# Patient Record
Sex: Female | Born: 1960 | Race: White | Hispanic: No | Marital: Married | Smoking: Never smoker
Health system: Southern US, Community
[De-identification: ages and names within clinical notes are randomized; demographics above are authoritative.]

## PROBLEM LIST (undated history)

## (undated) DIAGNOSIS — D229 Melanocytic nevi, unspecified: Secondary | ICD-10-CM

## (undated) DIAGNOSIS — M7500 Adhesive capsulitis of unspecified shoulder: Secondary | ICD-10-CM

## (undated) HISTORY — DX: Melanocytic nevi, unspecified: D22.9

## (undated) HISTORY — DX: Adhesive capsulitis of unspecified shoulder: M75.00

---

## 1997-11-05 ENCOUNTER — Other Ambulatory Visit: Admission: RE | Admit: 1997-11-05 | Discharge: 1997-11-05 | Payer: Self-pay | Admitting: Obstetrics and Gynecology

## 1998-12-26 ENCOUNTER — Other Ambulatory Visit: Admission: RE | Admit: 1998-12-26 | Discharge: 1998-12-26 | Payer: Self-pay | Admitting: *Deleted

## 1999-12-26 ENCOUNTER — Other Ambulatory Visit: Admission: RE | Admit: 1999-12-26 | Discharge: 1999-12-26 | Payer: Self-pay | Admitting: Obstetrics and Gynecology

## 2001-01-10 ENCOUNTER — Other Ambulatory Visit: Admission: RE | Admit: 2001-01-10 | Discharge: 2001-01-10 | Payer: Self-pay | Admitting: Obstetrics and Gynecology

## 2002-02-11 ENCOUNTER — Other Ambulatory Visit: Admission: RE | Admit: 2002-02-11 | Discharge: 2002-02-11 | Payer: Self-pay | Admitting: Obstetrics and Gynecology

## 2003-03-09 ENCOUNTER — Other Ambulatory Visit: Admission: RE | Admit: 2003-03-09 | Discharge: 2003-03-09 | Payer: Self-pay | Admitting: Obstetrics and Gynecology

## 2004-03-15 ENCOUNTER — Other Ambulatory Visit: Admission: RE | Admit: 2004-03-15 | Discharge: 2004-03-15 | Payer: Self-pay | Admitting: Obstetrics and Gynecology

## 2004-11-21 ENCOUNTER — Ambulatory Visit (HOSPITAL_COMMUNITY): Admission: RE | Admit: 2004-11-21 | Discharge: 2004-11-21 | Payer: Self-pay | Admitting: Obstetrics and Gynecology

## 2004-12-06 ENCOUNTER — Encounter: Admission: RE | Admit: 2004-12-06 | Discharge: 2004-12-06 | Payer: Self-pay | Admitting: Obstetrics and Gynecology

## 2005-04-02 ENCOUNTER — Other Ambulatory Visit: Admission: RE | Admit: 2005-04-02 | Discharge: 2005-04-02 | Payer: Self-pay | Admitting: Obstetrics and Gynecology

## 2006-04-03 ENCOUNTER — Other Ambulatory Visit: Admission: RE | Admit: 2006-04-03 | Discharge: 2006-04-03 | Payer: Self-pay | Admitting: Obstetrics & Gynecology

## 2007-04-23 ENCOUNTER — Other Ambulatory Visit: Admission: RE | Admit: 2007-04-23 | Discharge: 2007-04-23 | Payer: Self-pay | Admitting: Obstetrics & Gynecology

## 2008-03-24 ENCOUNTER — Encounter: Admission: RE | Admit: 2008-03-24 | Discharge: 2008-03-24 | Payer: Self-pay | Admitting: Obstetrics & Gynecology

## 2008-04-29 ENCOUNTER — Other Ambulatory Visit: Admission: RE | Admit: 2008-04-29 | Discharge: 2008-04-29 | Payer: Self-pay | Admitting: Obstetrics & Gynecology

## 2008-05-08 ENCOUNTER — Emergency Department (HOSPITAL_COMMUNITY): Admission: EM | Admit: 2008-05-08 | Discharge: 2008-05-08 | Payer: Self-pay | Admitting: Emergency Medicine

## 2010-05-06 ENCOUNTER — Other Ambulatory Visit: Payer: Self-pay | Admitting: Obstetrics & Gynecology

## 2010-05-06 DIAGNOSIS — Z1239 Encounter for other screening for malignant neoplasm of breast: Secondary | ICD-10-CM

## 2010-05-06 DIAGNOSIS — Z1231 Encounter for screening mammogram for malignant neoplasm of breast: Secondary | ICD-10-CM

## 2010-05-22 ENCOUNTER — Ambulatory Visit
Admission: RE | Admit: 2010-05-22 | Discharge: 2010-05-22 | Disposition: A | Discharge status: Home / Self Care | Payer: BC Managed Care – PPO | Source: Ambulatory Visit | Attending: Obstetrics & Gynecology | Admitting: Obstetrics & Gynecology

## 2010-05-22 DIAGNOSIS — Z1231 Encounter for screening mammogram for malignant neoplasm of breast: Secondary | ICD-10-CM

## 2010-07-31 LAB — POCT I-STAT, CHEM 8
BUN: 22 mg/dL (ref 6–23)
Chloride: 104 mEq/L (ref 96–112)
Creatinine, Ser: 0.8 mg/dL (ref 0.4–1.2)
Sodium: 135 mEq/L (ref 135–145)
TCO2: 23 mmol/L (ref 0–100)

## 2011-05-29 ENCOUNTER — Other Ambulatory Visit: Payer: Self-pay | Admitting: Obstetrics & Gynecology

## 2011-05-29 DIAGNOSIS — Z1231 Encounter for screening mammogram for malignant neoplasm of breast: Secondary | ICD-10-CM

## 2011-06-21 ENCOUNTER — Ambulatory Visit
Admission: RE | Admit: 2011-06-21 | Discharge: 2011-06-21 | Disposition: A | Discharge status: Home / Self Care | Payer: BC Managed Care – PPO | Source: Ambulatory Visit | Attending: Obstetrics & Gynecology | Admitting: Obstetrics & Gynecology

## 2011-06-21 DIAGNOSIS — Z1231 Encounter for screening mammogram for malignant neoplasm of breast: Secondary | ICD-10-CM

## 2012-07-23 ENCOUNTER — Other Ambulatory Visit: Payer: Self-pay

## 2012-07-23 MED ORDER — MINOCYCLINE HCL 100 MG PO CAPS: 100.0000 mg | ORAL_CAPSULE | Freq: Two times a day (BID) | ORAL | Status: DC

## 2012-07-24 ENCOUNTER — Encounter: Payer: Self-pay | Admitting: Obstetrics & Gynecology

## 2012-07-24 ENCOUNTER — Ambulatory Visit (INDEPENDENT_AMBULATORY_CARE_PROVIDER_SITE_OTHER): Payer: BC Managed Care – PPO | Admitting: Obstetrics & Gynecology

## 2012-07-24 VITALS — BP 112/68 | Ht 66.0 in | Wt 113.8 lb

## 2012-07-24 DIAGNOSIS — Z01419 Encounter for gynecological examination (general) (routine) without abnormal findings: Secondary | ICD-10-CM

## 2012-07-24 DIAGNOSIS — Z0000 Encounter for general adult medical examination without abnormal findings: Secondary | ICD-10-CM

## 2012-07-24 LAB — POCT URINALYSIS DIPSTICK
Bilirubin, UA: NEGATIVE
Glucose, UA: NEGATIVE
Ketones, UA: NEGATIVE
Leukocytes, UA: NEGATIVE
Protein, UA: NEGATIVE

## 2012-07-24 MED ORDER — NORGESTIM-ETH ESTRAD TRIPHASIC 0.18/0.215/0.25 MG-35 MCG PO TABS: 1.0000 | ORAL_TABLET | Freq: Every day | ORAL | Status: DC

## 2012-07-24 NOTE — Patient Instructions (Signed)

## 2012-07-24 NOTE — Progress Notes (Signed)
52 y.o. No obstetric history on file. MarriedCaucasianF here for annual exam.  Saw derm--Dr Haverstock--last week.  Recommended that she be off the minocycline.  Recommended Retin A product.  Moved to [place] to new home after selling her house and husband's house.  Last cycle was abnormal but she took her pills incorrectly in the move.  Flow is typically four days.  Heavy for one day.    Patient's last menstrual period was 06/29/2012.          Sexually active: yes  The current method of family planning is OCP (estrogen/progesterone).    Exercising: yes  pilates and exercise bike Smoker:  no  Health Maintenance: Pap:  06/21/11 WNL/negative HR HPV MMG:  06/21/11 normal Colonoscopy:  none BMD:   none TDaP:  2008 or 2009 Labs: 2013 here--all normal   reports that she has never smoked. She does not have any smokeless tobacco history on file. She reports that she does not drink alcohol or use illicit drugs.  Past Medical History  Diagnosis Date  . Atypical nevi     History reviewed. No pertinent past surgical history.  Current Outpatient Prescriptions  Medication Sig Dispense Refill  . minocycline (MINOCIN) 100 MG capsule Take 1 capsule (100 mg total) by mouth 2 (two) times daily.  60 capsule  0  . Multiple Vitamins-Minerals (MULTIVITAMIN PO) Take by mouth daily.      Lorita Officer Triphasic (TRI-SPRINTEC PO) Take by mouth daily.       No current facility-administered medications for this visit.    Family History  Problem Relation Age of Onset  . Cancer Maternal Grandmother   . Heart attack Father   . Alcohol abuse Paternal Grandfather   . Alcohol abuse Maternal Grandfather     ROS:  Pertinent items are noted in HPI.  Otherwise, a comprehensive ROS was negative.  Exam:   BP 112/68  Ht 5\' 6"  (1.676 m)  Wt 113 lb 12.8 oz (51.619 kg)  BMI 18.38 kg/m2  LMP 06/29/2012  Height:   Height: 5\' 6"  (167.6 cm)  Ht Readings from Last 3 Encounters:  07/24/12 5\' 6"  (1.676 m)     General appearance: alert, cooperative and appears stated age Head: Normocephalic, without obvious abnormality, atraumatic Neck: no adenopathy, supple, symmetrical, trachea midline and thyroid normal to inspection and palpation Lungs: clear to auscultation bilaterally Breasts: normal appearance, no masses or tenderness Heart: regular rate and rhythm Abdomen: soft, non-tender; bowel sounds normal; no masses,  no organomegaly Extremities: extremities normal, atraumatic, no cyanosis or edema Skin: Skin color, texture, turgor normal. No rashes or lesions Lymph nodes: Cervical, supraclavicular, and axillary nodes normal. No abnormal inguinal nodes palpated Neurologic: Grossly normal   Pelvic: External genitalia:  no lesions              Urethra:  normal appearing urethra with no masses, tenderness or lesions              Bartholins and Skenes: normal                 Vagina: normal appearing vagina with normal color and discharge, no lesions              Cervix: nabothian cyst and no lesions              Pap taken: no Bimanual Exam:  Uterus:  enlarged, 10-12 weeks size, mobile and off to pt's right--stable  Adnexa: no mass, fullness, tenderness               Rectovaginal: Confirms               Anus:  normal sphincter tone, no lesions  A:  Well Woman with normal exam H/o enlarged uterus c/w fibroids--stable in size Acne--seeing dermatologist yearly  P:   Mammogram yearly--d/w pt 3D Refill trisprintec qd one month/RF 1 year return annually or prn  An After Visit Summary was printed and given to the patient.

## 2012-07-25 LAB — HEMOGLOBIN, FINGERSTICK: Hemoglobin, fingerstick: 12.4 g/dL (ref 12.0–16.0)

## 2012-07-28 ENCOUNTER — Telehealth: Payer: Self-pay | Admitting: *Deleted

## 2012-07-28 NOTE — Telephone Encounter (Signed)
Called pt to see if she needed a rf on her tri previfem, pt says that she does not need it. Pt was here 07/24/12 for aex had rf x 1 year sent to her pharmacy

## 2012-08-18 ENCOUNTER — Other Ambulatory Visit: Payer: Self-pay

## 2012-08-18 DIAGNOSIS — Z1231 Encounter for screening mammogram for malignant neoplasm of breast: Secondary | ICD-10-CM

## 2012-09-23 ENCOUNTER — Ambulatory Visit
Admission: RE | Admit: 2012-09-23 | Discharge: 2012-09-23 | Disposition: A | Discharge status: Home / Self Care | Payer: BC Managed Care – PPO | Source: Ambulatory Visit

## 2012-09-23 DIAGNOSIS — Z1231 Encounter for screening mammogram for malignant neoplasm of breast: Secondary | ICD-10-CM

## 2013-01-09 ENCOUNTER — Encounter: Payer: Self-pay | Admitting: Obstetrics & Gynecology

## 2013-05-06 ENCOUNTER — Encounter: Payer: Self-pay | Admitting: Obstetrics & Gynecology

## 2013-06-28 ENCOUNTER — Other Ambulatory Visit: Payer: Self-pay | Admitting: Obstetrics & Gynecology

## 2013-06-29 ENCOUNTER — Other Ambulatory Visit: Payer: Self-pay

## 2013-08-07 ENCOUNTER — Ambulatory Visit: Payer: BC Managed Care – PPO | Admitting: Obstetrics & Gynecology

## 2013-08-14 ENCOUNTER — Encounter: Payer: Self-pay | Admitting: Obstetrics & Gynecology

## 2013-08-14 ENCOUNTER — Ambulatory Visit (INDEPENDENT_AMBULATORY_CARE_PROVIDER_SITE_OTHER): Payer: BC Managed Care – PPO | Admitting: Obstetrics & Gynecology

## 2013-08-14 VITALS — BP 104/62 | HR 60 | Resp 16 | Ht 66.0 in | Wt 111.0 lb

## 2013-08-14 DIAGNOSIS — N852 Hypertrophy of uterus: Secondary | ICD-10-CM

## 2013-08-14 DIAGNOSIS — Z01419 Encounter for gynecological examination (general) (routine) without abnormal findings: Secondary | ICD-10-CM

## 2013-08-14 DIAGNOSIS — Z124 Encounter for screening for malignant neoplasm of cervix: Secondary | ICD-10-CM

## 2013-08-14 DIAGNOSIS — Z0000 Encounter for general adult medical examination without abnormal findings: Secondary | ICD-10-CM

## 2013-08-14 DIAGNOSIS — N951 Menopausal and female climacteric states: Secondary | ICD-10-CM

## 2013-08-14 LAB — COMPREHENSIVE METABOLIC PANEL
ALT: 12 U/L (ref 0–35)
AST: 17 U/L (ref 0–37)
Albumin: 4 g/dL (ref 3.5–5.2)
Alkaline Phosphatase: 54 U/L (ref 39–117)
BUN: 17 mg/dL (ref 6–23)
CALCIUM: 9.3 mg/dL (ref 8.4–10.5)
CHLORIDE: 102 meq/L (ref 96–112)
CO2: 29 meq/L (ref 19–32)
CREATININE: 0.6 mg/dL (ref 0.50–1.10)
GLUCOSE: 81 mg/dL (ref 70–99)
Potassium: 4.2 mEq/L (ref 3.5–5.3)
Sodium: 136 mEq/L (ref 135–145)
Total Bilirubin: 0.3 mg/dL (ref 0.2–1.2)
Total Protein: 6.4 g/dL (ref 6.0–8.3)

## 2013-08-14 LAB — LIPID PANEL
CHOL/HDL RATIO: 2.1 ratio
CHOLESTEROL: 147 mg/dL (ref 0–200)
HDL: 69 mg/dL (ref >39)
LDL Cholesterol: 65 mg/dL (ref 0–99)
Triglycerides: 65 mg/dL (ref <150)
VLDL: 13 mg/dL (ref 0–40)

## 2013-08-14 LAB — POCT URINALYSIS DIPSTICK
Bilirubin, UA: NEGATIVE
Glucose, UA: NEGATIVE
KETONES UA: NEGATIVE
Leukocytes, UA: NEGATIVE
Nitrite, UA: NEGATIVE
PH UA: 5
PROTEIN UA: NEGATIVE
Urobilinogen, UA: NEGATIVE

## 2013-08-14 LAB — HEMOGLOBIN, FINGERSTICK: Hemoglobin, fingerstick: 13.9 g/dL (ref 12.0–16.0)

## 2013-08-14 MED ORDER — NORETHIN ACE-ETH ESTRAD-FE 1-20 MG-MCG PO TABS: 1.0000 | ORAL_TABLET | Freq: Every day | ORAL | Status: DC

## 2013-08-14 NOTE — Progress Notes (Signed)
53 y.o. G0P0000 (adopted 1) MarriedCaucasianF here for annual exam.  Has lots of plans for camp for the summer for 51 year old daughter.   Patient's last menstrual period was 07/26/2013.          Sexually active: yes  The current method of family planning is OCP (estrogen/progesterone).    Exercising: yes  walking Smoker:  no  Health Maintenance:  Pap: 06/21/11 WNL/negative HR HPV  MMG: 09/23/12 normal  Colonoscopy: none  BMD: none  TDaP: 2008 or 2009  Labs: 2013 here--all normal  Screening Labs: today, Hb today: today, Urine today: resulted   reports that she has never smoked. She has never used smokeless tobacco. She reports that she does not drink alcohol or use illicit drugs.  Past Medical History  Diagnosis Date  . Atypical nevi     History reviewed. No pertinent past surgical history.  Current Outpatient Prescriptions  Medication Sig Dispense Refill  . Multiple Vitamins-Minerals (MULTIVITAMIN PO) Take by mouth daily.      . TRI-PREVIFEM 0.18/0.215/0.25 MG-35 MCG tablet TAKE 1 TABLET EVERY DAY  84 tablet  0   No current facility-administered medications for this visit.    Family History  Problem Relation Age of Onset  . Cancer Maternal Grandmother   . Heart attack Father   . Alcohol abuse Paternal Grandfather   . Alcohol abuse Maternal Grandfather     ROS:  Pertinent items are noted in HPI.  Otherwise, a comprehensive ROS was negative.  Exam:   BP 104/62  Pulse 60  Resp 16  Ht 5\' 6"  (1.676 m)  Wt 111 lb (50.349 kg)  BMI 17.92 kg/m2  LMP 07/26/2013  Weight change:-2lb   Height: 5\' 6"  (167.6 cm)  Ht Readings from Last 3 Encounters:  08/14/13 5\' 6"  (1.676 m)  07/24/12 5\' 6"  (1.676 m)    General appearance: alert, cooperative and appears stated age Head: Normocephalic, without obvious abnormality, atraumatic Neck: no adenopathy, supple, symmetrical, trachea midline and thyroid normal to inspection and palpation Lungs: clear to auscultation  bilaterally Breasts: normal appearance, no masses or tenderness Heart: regular rate and rhythm Abdomen: soft, non-tender; bowel sounds normal; no masses,  no organomegaly Extremities: extremities normal, atraumatic, no cyanosis or edema Skin: Skin color, texture, turgor normal. No rashes or lesions Lymph nodes: Cervical, supraclavicular, and axillary nodes normal. No abnormal inguinal nodes palpated Neurologic: Grossly normal   Pelvic: External genitalia:  no lesions              Urethra:  normal appearing urethra with no masses, tenderness or lesions              Bartholins and Skenes: normal                 Vagina: normal appearing vagina with normal color and discharge, no lesions              Cervix: no lesions              Pap taken: yes Bimanual Exam:  Uterus:  enlarged, 8-10 weeks sized weeks size , off to left              Adnexa: normal adnexa               Rectovaginal: Confirms               Anus:  normal sphincter tone, no lesions  A:  Well Woman with normal exam Grade 4 breast density. On OCps  for [facility] Acne, off antibiotics  P:   Mammogram yearly.  3D discussed.   pap smear today.  HR HPV 2013 and neg. CMP, FSH, Lipids Change OCPs to Loestrin due to being 53.  Rx to pharmacy for 90 day supply Declines colonoscopy.  Advised I will continue to encourage her to do this. return annually or prn  An After Visit Summary was printed and given to the patient.

## 2013-08-14 NOTE — Progress Notes (Deleted)
53 y.o. G0P0000 MarriedCaucasianF here for annual exam.    Patient's last menstrual period was 07/26/2013.          Sexually active: yes  The current method of family planning is OCP (estrogen/progesterone).    Exercising: yes  pilates, walking, and exercise bike Smoker:  no  Health Maintenance: Pap:  06/21/11 WNL/negative HR HPV History of abnormal Pap:  no MMG:  09/23/12-WNL Colonoscopy:  none BMD:   none TDaP:  2008 or 2009 Screening Labs: ***, Hb today: ***, Urine today: trace   reports that she has never smoked. She has never used smokeless tobacco. She reports that she does not drink alcohol or use illicit drugs.  Past Medical History  Diagnosis Date  . Atypical nevi     History reviewed. No pertinent past surgical history.  Current Outpatient Prescriptions  Medication Sig Dispense Refill  . Multiple Vitamins-Minerals (MULTIVITAMIN PO) Take by mouth daily.      . TRI-PREVIFEM 0.18/0.215/0.25 MG-35 MCG tablet TAKE 1 TABLET EVERY DAY  84 tablet  0   No current facility-administered medications for this visit.    Family History  Problem Relation Age of Onset  . Cancer Maternal Grandmother   . Heart attack Father   . Alcohol abuse Paternal Grandfather   . Alcohol abuse Maternal Grandfather     ROS:  Pertinent items are noted in HPI.  Otherwise, a comprehensive ROS was negative.  Exam:   BP 104/62  Pulse 60  Resp 16  Ht 5\' 6"  (1.676 m)  Wt 111 lb (50.349 kg)  BMI 17.92 kg/m2  LMP 07/26/2013  Weight change: @WEIGHTCHANGE @ Height:   Height: 5\' 6"  (167.6 cm)  Ht Readings from Last 3 Encounters:  08/14/13 5\' 6"  (1.676 m)  07/24/12 5\' 6"  (1.676 m)    General appearance: alert, cooperative and appears stated age Head: Normocephalic, without obvious abnormality, atraumatic Neck: no adenopathy, supple, symmetrical, trachea midline and thyroid {EXAM; THYROID:18604} Lungs: clear to auscultation bilaterally Breasts: {Exam; breast:13139::"normal appearance, no masses or  tenderness"} Heart: regular rate and rhythm Abdomen: soft, non-tender; bowel sounds normal; no masses,  no organomegaly Extremities: extremities normal, atraumatic, no cyanosis or edema Skin: Skin color, texture, turgor normal. No rashes or lesions Lymph nodes: Cervical, supraclavicular, and axillary nodes normal. No abnormal inguinal nodes palpated Neurologic: Grossly normal   Pelvic: External genitalia:  no lesions              Urethra:  normal appearing urethra with no masses, tenderness or lesions              Bartholins and Skenes: normal                 Vagina: normal appearing vagina with normal color and discharge, no lesions              Cervix: {exam; cervix:14595}              Pap taken: {yes no:314532} Bimanual Exam:  Uterus:  {exam; uterus:12215}              Adnexa: {exam; adnexa:12223}               Rectovaginal: Confirms               Anus:  normal sphincter tone, no lesions  A:  Well Woman with normal exam  P:   {plan; gyn:5269::"mammogram","pap smear","return annually or prn"}  An After Visit Summary was printed and given to the patient.

## 2013-08-14 NOTE — Patient Instructions (Signed)

## 2013-08-15 LAB — FOLLICLE STIMULATING HORMONE: FSH: 1.7 m[IU]/mL

## 2013-08-17 LAB — IPS PAP SMEAR ONLY

## 2013-11-26 ENCOUNTER — Other Ambulatory Visit: Payer: Self-pay

## 2013-11-26 DIAGNOSIS — Z1231 Encounter for screening mammogram for malignant neoplasm of breast: Secondary | ICD-10-CM

## 2013-12-04 ENCOUNTER — Ambulatory Visit
Admission: RE | Admit: 2013-12-04 | Discharge: 2013-12-04 | Disposition: A | Discharge status: Home / Self Care | Payer: BC Managed Care – PPO | Source: Ambulatory Visit

## 2013-12-04 DIAGNOSIS — Z1231 Encounter for screening mammogram for malignant neoplasm of breast: Secondary | ICD-10-CM

## 2014-02-15 ENCOUNTER — Encounter: Payer: Self-pay | Admitting: Obstetrics & Gynecology

## 2014-08-21 ENCOUNTER — Other Ambulatory Visit: Payer: Self-pay | Admitting: Obstetrics & Gynecology

## 2014-08-23 NOTE — Telephone Encounter (Signed)
Medication refill request: Junel  Last AEX:  08/14/13 SM Next AEX: 09/02/14 SM Last MMG (if hormonal medication request): 12/07/13 BIRADS2:Benign  Refill authorized: 08/14/13 #3packs w/ 4 Refills. Today #1pack/0R?

## 2014-09-02 ENCOUNTER — Encounter: Payer: Self-pay | Admitting: Obstetrics & Gynecology

## 2014-09-02 ENCOUNTER — Ambulatory Visit (INDEPENDENT_AMBULATORY_CARE_PROVIDER_SITE_OTHER): Payer: BLUE CROSS/BLUE SHIELD | Admitting: Obstetrics & Gynecology

## 2014-09-02 VITALS — BP 104/62 | HR 72 | Resp 14 | Ht 65.75 in | Wt 110.2 lb

## 2014-09-02 DIAGNOSIS — Z01419 Encounter for gynecological examination (general) (routine) without abnormal findings: Secondary | ICD-10-CM

## 2014-09-02 DIAGNOSIS — Z1211 Encounter for screening for malignant neoplasm of colon: Secondary | ICD-10-CM | POA: Diagnosis not present

## 2014-09-02 DIAGNOSIS — Z0000 Encounter for general adult medical examination without abnormal findings: Secondary | ICD-10-CM | POA: Diagnosis not present

## 2014-09-02 DIAGNOSIS — N912 Amenorrhea, unspecified: Secondary | ICD-10-CM | POA: Diagnosis not present

## 2014-09-02 LAB — TSH: TSH: 3.524 u[IU]/mL (ref 0.350–4.500)

## 2014-09-02 LAB — COMPREHENSIVE METABOLIC PANEL
ALBUMIN: 4 g/dL (ref 3.5–5.2)
ALK PHOS: 52 U/L (ref 39–117)
ALT: 12 U/L (ref 0–35)
AST: 18 U/L (ref 0–37)
BUN: 15 mg/dL (ref 6–23)
CALCIUM: 9.4 mg/dL (ref 8.4–10.5)
CHLORIDE: 103 meq/L (ref 96–112)
CO2: 28 meq/L (ref 19–32)
Creat: 0.65 mg/dL (ref 0.50–1.10)
GLUCOSE: 66 mg/dL — AB (ref 70–99)
POTASSIUM: 4 meq/L (ref 3.5–5.3)
SODIUM: 138 meq/L (ref 135–145)
TOTAL PROTEIN: 6.4 g/dL (ref 6.0–8.3)
Total Bilirubin: 0.6 mg/dL (ref 0.2–1.2)

## 2014-09-02 LAB — POCT URINALYSIS DIPSTICK
PH UA: 5
UROBILINOGEN UA: NEGATIVE

## 2014-09-02 LAB — LIPID PANEL
CHOL/HDL RATIO: 2.1 ratio
CHOLESTEROL: 132 mg/dL (ref 0–200)
HDL: 63 mg/dL (ref >=46)
LDL Cholesterol: 58 mg/dL (ref 0–99)
Triglycerides: 55 mg/dL (ref <150)
VLDL: 11 mg/dL (ref 0–40)

## 2014-09-02 MED ORDER — NORETHIN ACE-ETH ESTRAD-FE 1-20 MG-MCG PO TABS: 1.0000 | ORAL_TABLET | Freq: Every day | ORAL | Status: DC

## 2014-09-02 NOTE — Progress Notes (Signed)
54 y.o. G0P0000 MarriedCaucasianF here for annual exam.  {t having minimal bleeding with cycles.  Has skipped cycles for several months.    Patient's last menstrual period was 08/24/2014.          Sexually active: Yes.    The current method of family planning is OCP (estrogen/progesterone).    Exercising: Yes.    walking, pilates, exercise bike 4x/wk Smoker:  no  Health Maintenance: Pap:  08/14/13 wnl no hpv testing done, neg HR HPV 2013 History of abnormal Pap:  no MMG:  12/07/13 bi-rads 2: benign Colonoscopy:  Never had one BMD:   Never had one  TDaP:  2008/2009 Screening Labs: yes , Hb today: 13.5 , Urine today:  WBC's +   reports that she has never smoked. She has never used smokeless tobacco. She reports that she does not drink alcohol or use illicit drugs.  Past Medical History  Diagnosis Date  . Atypical nevi     No past surgical history on file.  Current Outpatient Prescriptions  Medication Sig Dispense Refill  . JUNEL FE 1/20 1-20 MG-MCG tablet TAKE 1 TABLET EVERY DAY 1 Package 0  . Multiple Vitamins-Minerals (MULTIVITAMIN PO) Take by mouth daily.    Marland Kitchen adapalene (DIFFERIN) 0.1 % cream     . spironolactone (ALDACTONE) 50 MG tablet      No current facility-administered medications for this visit.    Family History  Problem Relation Age of Onset  . Cancer Maternal Grandmother   . Heart attack Father   . Alcohol abuse Paternal Grandfather   . Alcohol abuse Maternal Grandfather     ROS:  Pertinent items are noted in HPI.  Otherwise, a comprehensive ROS was negative.  Exam:   Ht 5' 5.75" (1.67 m)  Wt 110 lb 3.2 oz (49.986 kg)  BMI 17.92 kg/m2  LMP 08/24/2014  Weight change: -3#  Height: 5' 5.75" (167 cm)  Ht Readings from Last 3 Encounters:  09/02/14 5' 5.75" (1.67 m)  08/14/13 5\' 6"  (1.676 m)  07/24/12 5\' 6"  (1.676 m)    General appearance: alert, cooperative and appears stated age Head: Normocephalic, without obvious abnormality, atraumatic Neck: no  adenopathy, supple, symmetrical, trachea midline and thyroid normal to inspection and palpation Lungs: clear to auscultation bilaterally Breasts: normal appearance, no masses or tenderness Heart: regular rate and rhythm Abdomen: soft, non-tender; bowel sounds normal; no masses,  no organomegaly Extremities: extremities normal, atraumatic, no cyanosis or edema Skin: Skin color, texture, turgor normal. No rashes or lesions Lymph nodes: Cervical, supraclavicular, and axillary nodes normal. No abnormal inguinal nodes palpated Neurologic: Grossly normal   Pelvic: External genitalia:  no lesions              Urethra:  normal appearing urethra with no masses, tenderness or lesions              Bartholins and Skenes: normal                 Vagina: normal appearing vagina with normal color and discharge, no lesions              Cervix: no lesions              Pap taken: No. Bimanual Exam:  Uterus:  enlarged, 8 weeks size              Adnexa: normal adnexa and no mass, fullness, tenderness               Rectovaginal:  Confirms               Anus:  normal sphincter tone, no lesions  A:  Well Woman with normal exam Grade 4 breast density. On OCPs for BC Acne, off antibiotics H/O enlarged uterus most likely due to fibroids, smaller on exam today  P: Mammogram yearly. 3D discussed.  pap smear not obtained today. HR HPV 2013, neg and neg pap 2015. CMP, FSH, Lipids, TSH (non fasting) Loestrin rx to pharmacy Form for pt partially filled out but will need completion when labs are done. Declines colonoscopy. Advised I will continue to encourage her to do this.  IFOB given today return annually or prn

## 2014-09-03 LAB — FOLLICLE STIMULATING HORMONE: FSH: 35.7 m[IU]/mL

## 2014-09-06 LAB — HEMOGLOBIN, FINGERSTICK: HEMOGLOBIN, FINGERSTICK: 13.5 g/dL (ref 12.0–16.0)

## 2014-11-02 ENCOUNTER — Encounter: Payer: Self-pay | Admitting: Obstetrics & Gynecology

## 2014-11-15 ENCOUNTER — Other Ambulatory Visit: Payer: Self-pay | Admitting: Physical Medicine and Rehabilitation

## 2015-02-01 ENCOUNTER — Other Ambulatory Visit: Payer: Self-pay

## 2015-02-01 DIAGNOSIS — Z1231 Encounter for screening mammogram for malignant neoplasm of breast: Secondary | ICD-10-CM

## 2015-02-23 ENCOUNTER — Ambulatory Visit: Payer: Self-pay

## 2015-03-17 ENCOUNTER — Ambulatory Visit
Admission: RE | Admit: 2015-03-17 | Discharge: 2015-03-17 | Disposition: A | Discharge status: Home / Self Care | Payer: BLUE CROSS/BLUE SHIELD | Source: Ambulatory Visit

## 2015-03-17 DIAGNOSIS — Z1231 Encounter for screening mammogram for malignant neoplasm of breast: Secondary | ICD-10-CM

## 2015-11-16 ENCOUNTER — Other Ambulatory Visit: Payer: Self-pay | Admitting: Obstetrics & Gynecology

## 2015-11-17 ENCOUNTER — Ambulatory Visit: Payer: BLUE CROSS/BLUE SHIELD | Admitting: Obstetrics & Gynecology

## 2015-11-17 NOTE — Telephone Encounter (Signed)
Medication refill request: Junel Last AEX:  09/02/14 SM Next AEX: 11/24/15 SM Last MMG (if hormonal medication request): 03/17/15 BIRADS1  Refill authorized: 09/02/14 #3 Packages 4R. Please advise. Thank you.

## 2015-11-24 ENCOUNTER — Encounter: Payer: Self-pay | Admitting: Obstetrics & Gynecology

## 2015-11-24 ENCOUNTER — Ambulatory Visit (INDEPENDENT_AMBULATORY_CARE_PROVIDER_SITE_OTHER): Payer: BLUE CROSS/BLUE SHIELD | Admitting: Obstetrics & Gynecology

## 2015-11-24 VITALS — BP 90/60 | HR 74 | Resp 12 | Ht 65.75 in | Wt 111.8 lb

## 2015-11-24 DIAGNOSIS — Z124 Encounter for screening for malignant neoplasm of cervix: Secondary | ICD-10-CM

## 2015-11-24 DIAGNOSIS — N951 Menopausal and female climacteric states: Secondary | ICD-10-CM

## 2015-11-24 DIAGNOSIS — Z01419 Encounter for gynecological examination (general) (routine) without abnormal findings: Secondary | ICD-10-CM

## 2015-11-24 MED ORDER — NORETHIN ACE-ETH ESTRAD-FE 1-20 MG-MCG PO TABS: 1.0000 | ORAL_TABLET | Freq: Every day | ORAL | 4 refills | Status: DC

## 2015-11-24 NOTE — Progress Notes (Signed)
55 y.o. G0P0000 MarriedCaucasianF here for annual exam.  Doing well.  Busy with work.  Went to [place] to visit family.   Did international adoption and did lots of blood work and viral testing.  Pretty sure she had a hep C test.  Doing FSH today and may stop OCPs.  For now, going to see what kind of symptoms she has before starting any hormonal therapy.    No LMP recorded (within months).          Sexually active: Yes.    The current method of family planning is OCP (estrogen/progesterone).    Exercising: Yes.    pilates, walking Smoker:  no  Health Maintenance: Pap:  08/14/2013 negative  History of abnormal Pap:  no MMG:  03/17/2015 BIRADS 1 negative Colonoscopy:  Never.  Pt has called for an appt.   BMD:   never TDaP:  2008/2009 Pneumonia vaccine(s):  never Zostavax:   never Hep C testing: did with adoption process Screening Labs: done in May for insurance purposes, patient brought copy with her today, Hb today: done in May, Urine today: done in May    reports that she has never smoked. She has never used smokeless tobacco. She reports that she does not drink alcohol or use drugs.  Past Medical History:  Diagnosis Date  . Atypical nevi     No past surgical history on file.  Current Outpatient Prescriptions  Medication Sig Dispense Refill  . adapalene (DIFFERIN) 0.1 % cream     . JUNEL FE 1/20 1-20 MG-MCG tablet TAKE 1 TABLET BY MOUTH DAILY. 84 tablet 4  . Multiple Vitamins-Minerals (MULTIVITAMIN PO) Take by mouth daily.    Marland Kitchen spironolactone (ALDACTONE) 50 MG tablet      No current facility-administered medications for this visit.     Family History  Problem Relation Age of Onset  . Cancer Maternal Grandmother   . Heart attack Father   . Alcohol abuse Paternal Grandfather   . Alcohol abuse Maternal Grandfather     ROS:  Pertinent items are noted in HPI.  Otherwise, a comprehensive ROS was negative.  Exam:   BP 90/60 (BP Location: Right Arm, Patient Position: Sitting)    Pulse 74   Resp 12   Ht 5' 5.75" (1.67 m)   Wt 111 lb 12.8 oz (50.7 kg)   LMP  (Within Months)   BMI 18.18 kg/m     Height: 5' 5.75" (167 cm)  Ht Readings from Last 3 Encounters:  11/24/15 5' 5.75" (1.67 m)  09/02/14 5' 5.75" (1.67 m)  08/14/13 5\' 6"  (1.676 m)    General appearance: alert, cooperative and appears stated age Head: Normocephalic, without obvious abnormality, atraumatic Neck: no adenopathy, supple, symmetrical, trachea midline and thyroid normal to inspection and palpation Lungs: clear to auscultation bilaterally Breasts: normal appearance, no masses or tenderness Heart: regular rate and rhythm Abdomen: soft, non-tender; bowel sounds normal; no masses,  no organomegaly Extremities: extremities normal, atraumatic, no cyanosis or edema Skin: Skin color, texture, turgor normal. No rashes or lesions Lymph nodes: Cervical, supraclavicular, and axillary nodes normal. No abnormal inguinal nodes palpated Neurologic: Grossly normal   Pelvic: External genitalia:  no lesions              Urethra:  normal appearing urethra with no masses, tenderness or lesions              Bartholins and Skenes: normal  Vagina: normal appearing vagina with normal color and discharge, no lesions              Cervix: no lesions              Pap taken: Yes.   Bimanual Exam:  Uterus:  normal size, contour, position, consistency, mobility, non-tender              Adnexa: normal adnexa and no mass, fullness, tenderness               Rectovaginal: Confirms               Anus:  normal sphincter tone, no lesions  Chaperone was present for exam.  A:  Well Woman with normal exam Grade 4 breast density On OCPs for BC Acne, off antibiotics H/O enlarged uterus most likely due to fibroids  P: Mammogram yearly. 3D discussed.  Pap and HR HPV today.  Labs done for life insurance.  Will scan into EPIC copies of blood work. [place] today. Loestrin rx to pharmacy Pt states  she will call for colonoscopy this year. return annually or prn

## 2015-11-25 LAB — FOLLICLE STIMULATING HORMONE: FSH: 33.4 m[IU]/mL

## 2015-11-28 LAB — IPS PAP TEST WITH HPV

## 2016-04-04 ENCOUNTER — Other Ambulatory Visit: Payer: Self-pay | Admitting: Obstetrics & Gynecology

## 2016-04-04 DIAGNOSIS — Z1231 Encounter for screening mammogram for malignant neoplasm of breast: Secondary | ICD-10-CM

## 2016-06-11 ENCOUNTER — Ambulatory Visit: Payer: BLUE CROSS/BLUE SHIELD

## 2016-06-27 ENCOUNTER — Ambulatory Visit
Admission: RE | Admit: 2016-06-27 | Discharge: 2016-06-27 | Disposition: A | Discharge status: Home / Self Care | Payer: BLUE CROSS/BLUE SHIELD | Source: Ambulatory Visit | Attending: Obstetrics & Gynecology | Admitting: Obstetrics & Gynecology

## 2016-06-27 DIAGNOSIS — Z1231 Encounter for screening mammogram for malignant neoplasm of breast: Secondary | ICD-10-CM

## 2016-12-27 ENCOUNTER — Other Ambulatory Visit: Payer: Self-pay | Admitting: Obstetrics & Gynecology

## 2016-12-27 NOTE — Telephone Encounter (Signed)
Medication refill request: OCP  Last AEX:  11-24-15  Next AEX: 02-21-17  Last MMG (if hormonal medication request): 06-27-16 WNL Refill authorized: please advise

## 2017-02-21 ENCOUNTER — Encounter: Payer: Self-pay | Admitting: Obstetrics & Gynecology

## 2017-02-21 ENCOUNTER — Other Ambulatory Visit: Payer: Self-pay

## 2017-02-21 ENCOUNTER — Ambulatory Visit (INDEPENDENT_AMBULATORY_CARE_PROVIDER_SITE_OTHER): Payer: BLUE CROSS/BLUE SHIELD | Admitting: Obstetrics & Gynecology

## 2017-02-21 ENCOUNTER — Telehealth: Payer: Self-pay | Admitting: *Deleted

## 2017-02-21 VITALS — BP 110/64 | HR 76 | Resp 14 | Ht 66.25 in | Wt 111.0 lb

## 2017-02-21 DIAGNOSIS — M84374S Stress fracture, right foot, sequela: Secondary | ICD-10-CM | POA: Diagnosis not present

## 2017-02-21 DIAGNOSIS — L700 Acne vulgaris: Secondary | ICD-10-CM

## 2017-02-21 DIAGNOSIS — N914 Secondary oligomenorrhea: Secondary | ICD-10-CM

## 2017-02-21 DIAGNOSIS — Z01419 Encounter for gynecological examination (general) (routine) without abnormal findings: Secondary | ICD-10-CM | POA: Diagnosis not present

## 2017-02-21 DIAGNOSIS — L709 Acne, unspecified: Secondary | ICD-10-CM | POA: Insufficient documentation

## 2017-02-21 MED ORDER — NORETHIN ACE-ETH ESTRAD-FE 1-20 MG-MCG PO TABS: 1.0000 | ORAL_TABLET | Freq: Every day | ORAL | 4 refills | Status: DC

## 2017-02-21 NOTE — Telephone Encounter (Signed)
Left message per DPR for patient to return call to update of spouses DOB to complete cologuard form. Advised to return call to [place].

## 2017-02-21 NOTE — Telephone Encounter (Signed)
Spouse DOB 08/25/1952

## 2017-02-21 NOTE — Patient Instructions (Signed)
Do your bone density testing the same day that you do your mammogram.  The order has been placed.

## 2017-02-21 NOTE — Progress Notes (Signed)
56 y.o. G0P0000 MarriedCaucasianF here for annual exam.  Cycles are less frequent.  Had only two cycles.  Cycles are shorter and lighter.  Has gotten a flu shot.    Patient's last menstrual period was 10/14/2016 (approximate).          Sexually active: Yes.    The current method of family planning is OCP (estrogen/progesterone).    Exercising: Yes.    walking, pilates Smoker:  no  Health Maintenance: Pap:  11/24/15 Neg. HR HPV:neg   08/14/13 Neg  History of abnormal Pap:  no MMG:  06/27/16 BIRADS1:Neg  Colonoscopy:  Declines but willing to do cologuard BMD:  None TDaP:  2009 Pneumonia vaccine(s):  N/A Zostavax:   N/A Hep C testing: Done with lab work to do [place] Labs: only if needed.    reports that  has never smoked. she has never used smokeless tobacco. She reports that she does not drink alcohol or use drugs.  Past Medical History:  Diagnosis Date  . Atypical nevi     No past surgical history on file.  Current Outpatient Medications  Medication Sig Dispense Refill  . JUNEL FE 1/20 1-20 MG-MCG tablet TAKE 1 TABLET BY MOUTH DAILY. 84 tablet 0  . Multiple Vitamins-Minerals (MULTIVITAMIN PO) Take by mouth daily.     No current facility-administered medications for this visit.     Family History  Problem Relation Age of Onset  . Cancer Maternal Grandmother   . Heart attack Father   . Alcohol abuse Paternal Grandfather   . Alcohol abuse Maternal Grandfather   . Breast cancer Neg Hx     ROS:  Pertinent items are noted in HPI.  Otherwise, a comprehensive ROS was negative.  Exam:   BP 110/64 (BP Location: Right Arm, Patient Position: Sitting, Cuff Size: Normal)   Pulse 76   Resp 14   Ht 5' 6.25" (1.683 m)   Wt 111 lb (50.3 kg)   LMP 10/14/2016 (Approximate)   BMI 17.78 kg/m   Weight change: stable   Height: 5' 6.25" (168.3 cm)  Ht Readings from Last 3 Encounters:  02/21/17 5' 6.25" (1.683 m)  11/24/15 5' 5.75" (1.67 m)  09/02/14 5' 5.75"  (1.67 m)    General appearance: alert, cooperative and appears stated age Head: Normocephalic, without obvious abnormality, atraumatic Neck: no adenopathy, supple, symmetrical, trachea midline and thyroid normal to inspection and palpation Lungs: clear to auscultation bilaterally Breasts: normal appearance, no masses or tenderness Heart: regular rate and rhythm Abdomen: soft, non-tender; bowel sounds normal; no masses,  no organomegaly Extremities: extremities normal, atraumatic, no cyanosis or edema Skin: Skin color, texture, turgor normal. No rashes or lesions Lymph nodes: Cervical, supraclavicular, and axillary nodes normal. No abnormal inguinal nodes palpated Neurologic: Grossly normal   Pelvic: External genitalia:  no lesions              Urethra:  normal appearing urethra with no masses, tenderness or lesions              Bartholins and Skenes: normal                 Vagina: normal appearing vagina with normal color and discharge, no lesions              Cervix: no lesions              Pap taken: No. Bimanual Exam:  Uterus:  enlarged, about 8  weeks size, globular  Adnexa: no mass, fullness, tenderness               Rectovaginal: Confirms               Anus:  normal sphincter tone, no lesions  Chaperone was present for exam.  A:  Well Woman with normal exam Grad 4 breast density On OCPs for contraception H/o enlarged uterus, smaller this year Acne  H/o atypical mole  P:   Mammogram guidelines reviewed.  Doing 3D BMD will be planned this next year with MMG Declines colonoscopy.  Will consider doing cologuard pap smear not needed.  Neg pap and neg HR HPV 2017 FSH and AMH will be obtained today Tdap due next year return annually or prn

## 2017-02-21 NOTE — Telephone Encounter (Signed)
Cologuard form updated with spouse's DOB.   Encounter closed.

## 2017-03-01 LAB — ANTI MULLERIAN HORMONE

## 2017-03-01 LAB — FOLLICLE STIMULATING HORMONE: FSH: 20.9 m[IU]/mL

## 2017-03-04 ENCOUNTER — Telehealth: Payer: Self-pay

## 2017-03-04 NOTE — Telephone Encounter (Signed)
-----   Message from Megan Salon, MD sent at 03/04/2017 11:53 AM EST ----- Please let pt know her [facility] is still low but the [facility] is showing menopause.  The [facility] is more accurate if a woman is taking OCPs (and she is).  I'd like her to stop the OCPs for a month and repeat the [facility].  Please place order and schedule appt. Thanks.

## 2017-03-04 NOTE — Telephone Encounter (Signed)
Patient returned call to Kaitlyn. °

## 2017-03-04 NOTE — Telephone Encounter (Signed)
Spoke with patient. Advised of results as seen below from [place]. Patient verbalizes understanding. Unable to schedule an appointment at this time as she does not have her calendar with her. Will return call to schedule at a later date. Encounter closed.

## 2017-03-04 NOTE — Telephone Encounter (Signed)
Left message to call Kaitlyn at [phone]. 

## 2017-03-24 ENCOUNTER — Other Ambulatory Visit: Payer: Self-pay | Admitting: Obstetrics & Gynecology

## 2017-03-24 DIAGNOSIS — E2839 Other primary ovarian failure: Secondary | ICD-10-CM

## 2017-03-24 DIAGNOSIS — Z780 Asymptomatic menopausal state: Secondary | ICD-10-CM

## 2017-03-27 ENCOUNTER — Other Ambulatory Visit: Payer: Self-pay | Admitting: Obstetrics & Gynecology

## 2017-03-27 DIAGNOSIS — Z139 Encounter for screening, unspecified: Secondary | ICD-10-CM

## 2017-04-12 ENCOUNTER — Telehealth: Payer: Self-pay | Admitting: Obstetrics & Gynecology

## 2017-04-12 ENCOUNTER — Other Ambulatory Visit (INDEPENDENT_AMBULATORY_CARE_PROVIDER_SITE_OTHER): Payer: BLUE CROSS/BLUE SHIELD

## 2017-04-12 DIAGNOSIS — N914 Secondary oligomenorrhea: Secondary | ICD-10-CM

## 2017-04-12 NOTE — Telephone Encounter (Signed)
Patient would like to come in for additional fsh testing today. No orders in system.

## 2017-04-12 NOTE — Telephone Encounter (Signed)
Spoke with patient. Request to schedule recommended repeat [facility] lab -see lab results dated 02/21/17 and recommendations per Dr. Sabra Heck. Patient stopped OCP 03/05/17.   Lab appt scheduled for today at 1:30pm. Patient is agreeable to date and time.   Order placed for [facility].   Routing to provider for final review. Patient is agreeable to disposition. Will close encounter.

## 2017-04-13 LAB — FOLLICLE STIMULATING HORMONE: FSH: 81.8 m[IU]/mL

## 2017-04-15 ENCOUNTER — Telehealth: Payer: Self-pay | Admitting: *Deleted

## 2017-04-15 NOTE — Telephone Encounter (Signed)
-----   Message from Megan Salon, MD sent at 04/14/2017 10:56 PM EST ----- Please let pt know her [place] was in menopausal range.  Level is 81.  Does not need OCPs any longer.  Should not have bleeding.  No pregnancy risk.  If has bleeding, she does need to call.  Thanks.

## 2017-04-15 NOTE — Telephone Encounter (Signed)
LM for pt to call back.

## 2017-04-15 NOTE — Telephone Encounter (Signed)
Patient returned call

## 2017-04-16 DIAGNOSIS — M7500 Adhesive capsulitis of unspecified shoulder: Secondary | ICD-10-CM

## 2017-04-16 HISTORY — DX: Adhesive capsulitis of unspecified shoulder: M75.00

## 2017-04-17 NOTE — Telephone Encounter (Signed)
Pt notified.  Verbalized understanding.

## 2017-05-30 LAB — COLOGUARD: COLOGUARD: NEGATIVE

## 2017-06-12 ENCOUNTER — Telehealth: Payer: Self-pay | Admitting: Obstetrics & Gynecology

## 2017-06-12 DIAGNOSIS — Z1211 Encounter for screening for malignant neoplasm of colon: Secondary | ICD-10-CM

## 2017-06-12 NOTE — Telephone Encounter (Signed)
Routing to Karmen Bongo, Therapist, sports.  Raquel Sarna, have you received Cologuard results?

## 2017-06-12 NOTE — Telephone Encounter (Signed)
Patient called for her recent Cologard results. She said the company that did the testing faxed the results to our office on 05/30/17.

## 2017-06-13 NOTE — Telephone Encounter (Signed)
Spoke with Human resources officer at [employer]. Will re fax a copy of results to our office for Dr. Ammie Ferrier review.

## 2017-06-17 NOTE — Telephone Encounter (Signed)
Patient calling for cologuard results. 

## 2017-06-17 NOTE — Telephone Encounter (Signed)
Spoke with patient, advised of negative cologuard testing. Patient verbalizes understanding and is agreeable.   Cologuard results entered into Epic, results to scan.   Routing to provider for final review. Patient is agreeable to disposition. Will close encounter.

## 2017-06-18 ENCOUNTER — Encounter: Payer: Self-pay | Admitting: Obstetrics & Gynecology

## 2017-07-08 ENCOUNTER — Ambulatory Visit
Admission: RE | Admit: 2017-07-08 | Discharge: 2017-07-08 | Disposition: A | Discharge status: Home / Self Care | Payer: BLUE CROSS/BLUE SHIELD | Source: Ambulatory Visit | Attending: Obstetrics & Gynecology | Admitting: Obstetrics & Gynecology

## 2017-07-08 DIAGNOSIS — Z139 Encounter for screening, unspecified: Secondary | ICD-10-CM

## 2017-07-08 DIAGNOSIS — Z780 Asymptomatic menopausal state: Secondary | ICD-10-CM

## 2017-07-08 DIAGNOSIS — E2839 Other primary ovarian failure: Secondary | ICD-10-CM

## 2018-05-15 ENCOUNTER — Ambulatory Visit: Payer: BLUE CROSS/BLUE SHIELD | Admitting: Obstetrics & Gynecology

## 2018-05-27 ENCOUNTER — Other Ambulatory Visit (HOSPITAL_COMMUNITY)
Admission: RE | Admit: 2018-05-27 | Discharge: 2018-05-27 | Disposition: A | Discharge status: Home / Self Care | Payer: 59 | Source: Ambulatory Visit | Attending: Obstetrics & Gynecology | Admitting: Obstetrics & Gynecology

## 2018-05-27 ENCOUNTER — Ambulatory Visit: Payer: 59 | Admitting: Obstetrics & Gynecology

## 2018-05-27 ENCOUNTER — Encounter: Payer: Self-pay | Admitting: Obstetrics & Gynecology

## 2018-05-27 VITALS — BP 92/66 | HR 76 | Resp 14 | Ht 66.0 in | Wt 110.6 lb

## 2018-05-27 DIAGNOSIS — Z01419 Encounter for gynecological examination (general) (routine) without abnormal findings: Secondary | ICD-10-CM | POA: Diagnosis not present

## 2018-05-27 DIAGNOSIS — Z0000 Encounter for general adult medical examination without abnormal findings: Secondary | ICD-10-CM | POA: Diagnosis not present

## 2018-05-27 DIAGNOSIS — Z124 Encounter for screening for malignant neoplasm of cervix: Secondary | ICD-10-CM | POA: Insufficient documentation

## 2018-05-27 DIAGNOSIS — Z23 Encounter for immunization: Secondary | ICD-10-CM

## 2018-05-27 NOTE — Progress Notes (Signed)
58 y.o. G0P0000 Married White or Caucasian female here for annual exam.  Doing well.  Had bilateral frozen shoulders this past year.  Saw ortho, at [place] and Dr. Latanya Maudlin, and has done physical exam.   Denies vaginal bleeding.  Doing well with menopausal symptoms.  Sleep is pretty good.      PCP:  Dr. Justin Mend.  Hasn't been seen for a regular visit this past year.    Patient's last menstrual period was 10/14/2016 (approximate).          Sexually active: Yes.    The current method of family planning is post menopausal status.    Exercising: Yes.    walk, low impact aerobics Smoker:  no  Health Maintenance: Pap:  11/24/15 Neg. HR HPV:neg   08/14/13 neg  History of abnormal Pap:  no MMG:  07/08/17 BIRADS1:neg Colonoscopy:  Cologuard 05/21/17 neg  BMD:   07/08/17 osteopenia, -1.4 TDaP:  2009 Pneumonia vaccine(s):  n/a Shingrix:  Declines for now Hep C testing: done with adoption process Screening Labs: will do today    reports that she has never smoked. She has never used smokeless tobacco. She reports that she does not drink alcohol or use drugs.  Past Medical History:  Diagnosis Date  . Atypical nevi   . 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 No pertinent surgical history.  Current Outpatient Medications  Medication Sig Dispense Refill  . calcium carbonate (OS-CAL) 1250 (500 Ca) MG chewable tablet Chew 1 tablet by mouth daily.    . Multiple Vitamins-Minerals (MULTIVITAMIN PO) Take by mouth daily.     No current facility-administered medications for this visit.     Family History  Problem Relation Age of Onset  . Cancer Maternal Grandmother   . Heart attack Father   . Alcohol abuse Paternal Grandfather   . Alcohol abuse Maternal Grandfather   . Breast cancer Neg Hx     Review of Systems  All other systems reviewed and are negative.   Exam:   BP 92/66 (BP Location: Left Arm, Patient Position: Sitting, Cuff Size: Normal)   Pulse 76   Resp 14   Ht  5\' 6"  (1.676 m)   Wt 110 lb 9.6 oz (50.2 kg)   LMP 10/14/2016 (Approximate)   BMI 17.85 kg/m      Height: 5\' 6"  (167.6 cm)  Ht Readings from Last 3 Encounters:  05/27/18 5\' 6"  (1.676 m)  02/21/17 5' 6.25" (1.683 m)  11/24/15 5' 5.75" (1.67 m)    General appearance: alert, cooperative and appears stated age Head: Normocephalic, without obvious abnormality, atraumatic Neck: no adenopathy, supple, symmetrical, trachea midline and thyroid normal to inspection and palpation Lungs: clear to auscultation bilaterally Breasts: normal appearance, no masses or tenderness Heart: regular rate and rhythm Abdomen: soft, non-tender; bowel sounds normal; no masses,  no organomegaly Extremities: extremities normal, atraumatic, no cyanosis or edema Skin: Skin color, texture, turgor normal. No rashes or lesions Lymph nodes: Cervical, supraclavicular, and axillary nodes normal. No abnormal inguinal nodes palpated Neurologic: Grossly normal   Pelvic: External genitalia:  no lesions              Urethra:  normal appearing urethra with no masses, tenderness or lesions              Bartholins and Skenes: normal                 Vagina: normal appearing vagina with normal color and  discharge, no lesions              Cervix: no lesions              Pap taken: Yes.   Bimanual Exam:  Uterus:  normal size, contour, position, consistency, mobility, non-tender              Adnexa: normal adnexa and no mass, fullness, tenderness               Rectovaginal: Confirms               Anus:  normal sphincter tone, no lesions  Chaperone was present for exam.  A:  Well Woman with normal exam Significant breast density, Grade C H/O uterine fibroids, uterus much smaller this year Atrophic changes of vagina H/o atypical mole Mild osteopenia  P:   Mammogram guidelines reviewed pap smear obtained today CBC, CMP, Lipids, TSH, Vit D will be obtained today Shingrix vaccination discussed Tdap will be updated  today cologuard done 3/19 and was negative Consider BMD in another 3-4 years Return annually or prn

## 2018-05-28 LAB — COMPREHENSIVE METABOLIC PANEL
A/G RATIO: 2 (ref 1.2–2.2)
ALBUMIN: 4.3 g/dL (ref 3.8–4.9)
ALT: 23 IU/L (ref 0–32)
AST: 23 IU/L (ref 0–40)
Alkaline Phosphatase: 94 IU/L (ref 39–117)
BUN / CREAT RATIO: 25 — AB (ref 9–23)
BUN: 17 mg/dL (ref 6–24)
Bilirubin Total: 0.4 mg/dL (ref 0.0–1.2)
CALCIUM: 9.5 mg/dL (ref 8.7–10.2)
CO2: 25 mmol/L (ref 20–29)
CREATININE: 0.67 mg/dL (ref 0.57–1.00)
Chloride: 105 mmol/L (ref 96–106)
GFR calc Af Amer: 114 mL/min/{1.73_m2} (ref >59)
GFR, EST NON AFRICAN AMERICAN: 99 mL/min/{1.73_m2} (ref >59)
GLOBULIN, TOTAL: 2.2 g/dL (ref 1.5–4.5)
Glucose: 59 mg/dL — ABNORMAL LOW (ref 65–99)
Potassium: 4.5 mmol/L (ref 3.5–5.2)
SODIUM: 144 mmol/L (ref 134–144)
Total Protein: 6.5 g/dL (ref 6.0–8.5)

## 2018-05-28 LAB — CBC
HEMATOCRIT: 37 % (ref 34.0–46.6)
HEMOGLOBIN: 12.4 g/dL (ref 11.1–15.9)
MCH: 32.1 pg (ref 26.6–33.0)
MCHC: 33.5 g/dL (ref 31.5–35.7)
MCV: 96 fL (ref 79–97)
Platelets: 294 10*3/uL (ref 150–450)
RBC: 3.86 x10E6/uL (ref 3.77–5.28)
RDW: 11.8 % (ref 11.7–15.4)
WBC: 4.4 10*3/uL (ref 3.4–10.8)

## 2018-05-28 LAB — LIPID PANEL
CHOL/HDL RATIO: 2.4 ratio (ref 0.0–4.4)
CHOLESTEROL TOTAL: 157 mg/dL (ref 100–199)
HDL: 65 mg/dL (ref >39)
LDL CALC: 82 mg/dL (ref 0–99)
Triglycerides: 51 mg/dL (ref 0–149)
VLDL CHOLESTEROL CAL: 10 mg/dL (ref 5–40)

## 2018-05-28 LAB — VITAMIN D 25 HYDROXY (VIT D DEFICIENCY, FRACTURES): VIT D 25 HYDROXY: 61.3 ng/mL (ref 30.0–100.0)

## 2018-05-28 LAB — TSH: TSH: 2.92 u[IU]/mL (ref 0.450–4.500)

## 2018-05-29 LAB — CYTOLOGY - PAP: Diagnosis: NEGATIVE

## 2018-05-30 ENCOUNTER — Other Ambulatory Visit: Payer: Self-pay | Admitting: Family Medicine

## 2018-05-30 DIAGNOSIS — Z1231 Encounter for screening mammogram for malignant neoplasm of breast: Secondary | ICD-10-CM

## 2018-07-14 ENCOUNTER — Ambulatory Visit: Payer: 59

## 2019-06-01 ENCOUNTER — Other Ambulatory Visit: Payer: Self-pay | Admitting: Obstetrics & Gynecology

## 2019-06-01 ENCOUNTER — Other Ambulatory Visit: Payer: Self-pay

## 2019-06-01 ENCOUNTER — Ambulatory Visit
Admission: RE | Admit: 2019-06-01 | Discharge: 2019-06-01 | Disposition: A | Discharge status: Home / Self Care | Payer: 59 | Source: Ambulatory Visit | Attending: Family Medicine | Admitting: Family Medicine

## 2019-06-01 DIAGNOSIS — Z1231 Encounter for screening mammogram for malignant neoplasm of breast: Secondary | ICD-10-CM

## 2019-10-02 ENCOUNTER — Ambulatory Visit: Payer: 59 | Admitting: Obstetrics & Gynecology

## 2020-04-22 ENCOUNTER — Other Ambulatory Visit: Payer: Self-pay | Admitting: Obstetrics & Gynecology

## 2020-04-22 DIAGNOSIS — Z1231 Encounter for screening mammogram for malignant neoplasm of breast: Secondary | ICD-10-CM

## 2020-05-09 ENCOUNTER — Ambulatory Visit: Payer: 59

## 2020-06-06 ENCOUNTER — Inpatient Hospital Stay: Admission: RE | Admit: 2020-06-06 | Payer: 59 | Source: Ambulatory Visit

## 2020-07-29 ENCOUNTER — Other Ambulatory Visit: Payer: Self-pay

## 2020-07-29 ENCOUNTER — Ambulatory Visit
Admission: RE | Admit: 2020-07-29 | Discharge: 2020-07-29 | Disposition: A | Discharge status: Home / Self Care | Payer: 59 | Source: Ambulatory Visit | Attending: Obstetrics & Gynecology | Admitting: Obstetrics & Gynecology

## 2020-07-29 DIAGNOSIS — Z1231 Encounter for screening mammogram for malignant neoplasm of breast: Secondary | ICD-10-CM

## 2020-09-23 ENCOUNTER — Encounter (HOSPITAL_BASED_OUTPATIENT_CLINIC_OR_DEPARTMENT_OTHER): Payer: Self-pay | Admitting: Obstetrics & Gynecology

## 2020-09-23 ENCOUNTER — Other Ambulatory Visit: Payer: Self-pay

## 2020-09-23 ENCOUNTER — Ambulatory Visit (INDEPENDENT_AMBULATORY_CARE_PROVIDER_SITE_OTHER): Payer: 59 | Admitting: Obstetrics & Gynecology

## 2020-09-23 VITALS — BP 123/81 | HR 73 | Ht 65.5 in | Wt 113.0 lb

## 2020-09-23 DIAGNOSIS — Z780 Asymptomatic menopausal state: Secondary | ICD-10-CM | POA: Diagnosis not present

## 2020-09-23 DIAGNOSIS — Z872 Personal history of diseases of the skin and subcutaneous tissue: Secondary | ICD-10-CM | POA: Diagnosis not present

## 2020-09-23 DIAGNOSIS — Z01419 Encounter for gynecological examination (general) (routine) without abnormal findings: Secondary | ICD-10-CM

## 2020-09-23 NOTE — Progress Notes (Signed)
60 y.o. G0P0000 Married White or Caucasian female here for annual exam.  Doing well.  Denies vaginal bleeding.  Working from home still.    PCP: doesn't have one.  Released as pt due to no recent appointments.  Patient's last menstrual period was 10/14/2016 (approximate).          Sexually active: Yes.    The current method of family planning is post menopausal status.    Exercising: Yes.     walking Smoker:  no  Health Maintenance: Pap:  05/29/2018 Negative History of abnormal Pap:  no MMG:  08/01/2020 negative Colonoscopy:  2019.  Declines colonoscopy or having repeat cologuard this year. BMD:   07/08/2017 Osteopenia TDaP:  05/2018 Shingrix:   declines Hep C testing: plan with lab work next Screening Labs: plan fasting lab work   reports that she has never smoked. She has never used smokeless tobacco. She reports that she does not drink alcohol and does not use drugs.  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  Current Outpatient Medications  Medication Sig Dispense Refill   Multiple Vitamins-Minerals (MULTIVITAMIN PO) Take by mouth daily.     calcium carbonate (OS-CAL) 1250 (500 Ca) MG chewable tablet Chew 1 tablet by mouth daily. (Patient not taking: Reported on 09/23/2020)     No current facility-administered medications for this visit.    Family History  Problem Relation Age of Onset   Cancer Maternal Grandmother    Heart attack Father    Alcohol abuse Paternal Grandfather    Alcohol abuse Maternal Grandfather    Breast cancer Neg Hx     Review of Systems  All other systems reviewed and are negative.  Exam:   BP 123/81 (BP Location: Right Arm, Patient Position: Sitting, Cuff Size: Small)   Pulse 73   Ht 5' 5.5" (1.664 m)   Wt 113 lb (51.3 kg)   LMP 10/14/2016 (Approximate)   BMI 18.52 kg/m   Height: 5' 5.5" (166.4 cm)  General appearance: alert, cooperative and  appears stated age Head: Normocephalic, without obvious abnormality, atraumatic Neck: no adenopathy, supple, symmetrical, trachea midline and thyroid normal to inspection and palpation Lungs: clear to auscultation bilaterally Breasts: normal appearance, no masses or tenderness Heart: regular rate and rhythm Abdomen: soft, non-tender; bowel sounds normal; no masses,  no organomegaly Extremities: extremities normal, atraumatic, no cyanosis or edema Skin: Skin color, texture, turgor normal. No rashes or lesions Lymph nodes: Cervical, supraclavicular, and axillary nodes normal. No abnormal inguinal nodes palpated Neurologic: Grossly normal   Pelvic: External genitalia:  no lesions              Urethra:  normal appearing urethra with no masses, tenderness or lesions              Bartholins and Skenes: normal                 Vagina: normal appearing vagina with normal color and no discharge, no lesions              Cervix: no lesions              Pap taken: No. Bimanual Exam:  Uterus:  normal size, contour, position, consistency, mobility, non-tender              Adnexa: normal adnexa and no mass, fullness, tenderness  Rectovaginal: Confirms               Anus:  normal sphincter tone, no lesions  Chaperone, Octaviano Batty, CMA, was present for exam.  Assessment/Plan: 1. Well woman exam with routine gynecological exam - Pap 05/29/2018 that was negative with neg HR HPV.  Not indicated today. - MMG 08/01/2020 - colonoscopy never done.  Cologuard done 2019.  She declines having this repeat this year due to cost in 2019 and declined colonoscopy.  - Plan BMD closer to age 73 - plan fasting lab work next year - vaccines updated/reviewed  2. Postmenopausal - no HRT  3. History of acne - off oral medications at this time

## 2020-10-28 ENCOUNTER — Encounter (HOSPITAL_BASED_OUTPATIENT_CLINIC_OR_DEPARTMENT_OTHER): Payer: Self-pay | Admitting: *Deleted

## 2020-10-28 NOTE — Progress Notes (Signed)
Received notification that Cologuard testing was overdue. Pt was seen for annual exam 09/23/20 and declined Cologuard and colonoscopy testing.

## 2021-07-03 ENCOUNTER — Other Ambulatory Visit: Payer: Self-pay | Admitting: Obstetrics & Gynecology

## 2021-07-03 DIAGNOSIS — Z1231 Encounter for screening mammogram for malignant neoplasm of breast: Secondary | ICD-10-CM

## 2021-08-22 ENCOUNTER — Ambulatory Visit
Admission: RE | Admit: 2021-08-22 | Discharge: 2021-08-22 | Disposition: A | Discharge status: Home / Self Care | Payer: 59 | Source: Ambulatory Visit | Attending: Obstetrics & Gynecology | Admitting: Obstetrics & Gynecology

## 2021-08-22 DIAGNOSIS — Z1231 Encounter for screening mammogram for malignant neoplasm of breast: Secondary | ICD-10-CM

## 2021-09-25 ENCOUNTER — Ambulatory Visit (HOSPITAL_BASED_OUTPATIENT_CLINIC_OR_DEPARTMENT_OTHER): Payer: 59 | Admitting: Obstetrics & Gynecology

## 2022-01-05 ENCOUNTER — Other Ambulatory Visit (HOSPITAL_COMMUNITY)
Admission: RE | Admit: 2022-01-05 | Discharge: 2022-01-05 | Disposition: A | Discharge status: Home / Self Care | Payer: 59 | Source: Ambulatory Visit | Attending: Obstetrics & Gynecology | Admitting: Obstetrics & Gynecology

## 2022-01-05 ENCOUNTER — Ambulatory Visit (INDEPENDENT_AMBULATORY_CARE_PROVIDER_SITE_OTHER): Payer: 59 | Admitting: Obstetrics & Gynecology

## 2022-01-05 ENCOUNTER — Encounter (HOSPITAL_BASED_OUTPATIENT_CLINIC_OR_DEPARTMENT_OTHER): Payer: Self-pay | Admitting: Obstetrics & Gynecology

## 2022-01-05 VITALS — BP 108/74 | HR 67 | Ht 65.0 in | Wt 109.4 lb

## 2022-01-05 DIAGNOSIS — Z01419 Encounter for gynecological examination (general) (routine) without abnormal findings: Secondary | ICD-10-CM | POA: Diagnosis not present

## 2022-01-05 DIAGNOSIS — Z124 Encounter for screening for malignant neoplasm of cervix: Secondary | ICD-10-CM | POA: Insufficient documentation

## 2022-01-05 DIAGNOSIS — M8580 Other specified disorders of bone density and structure, unspecified site: Secondary | ICD-10-CM

## 2022-01-05 DIAGNOSIS — Z23 Encounter for immunization: Secondary | ICD-10-CM

## 2022-01-05 DIAGNOSIS — Z0000 Encounter for general adult medical examination without abnormal findings: Secondary | ICD-10-CM

## 2022-01-05 NOTE — Progress Notes (Unsigned)
61 y.o. G0P0000 Married White or Caucasian female here for annual exam.  Denies vaginal bleeding.  Doing well.  Needs blood work this year.    Patient's last menstrual period was 10/14/2016 (approximate).          Sexually active: Yes.    The current method of family planning is post menopausal status.    Exercising: Yes.     walking Smoker:  no  Health Maintenance: Pap:  05/27/2018 Negative History of abnormal Pap:  no MMG:  08/22/2021 Negative Colonoscopy:  cologuard in 2019.  Declines testing.  Discussed this again today. BMD:   07/08/2017 Mild Osteopenia Screening Labs: ordered today   reports that she has never smoked. She has never used smokeless tobacco. She reports that she does not drink alcohol and does not use drugs.  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  Current Outpatient Medications  Medication Sig Dispense Refill   Multiple Vitamins-Minerals (MULTIVITAMIN PO) Take by mouth daily.     No current facility-administered medications for this visit.    Family History  Problem Relation Age of Onset   Cancer Maternal Grandmother    Heart attack Father    Alcohol abuse Paternal Grandfather    Alcohol abuse Maternal Grandfather    Breast cancer Neg Hx     ROS: Constitutional: negative Genitourinary:negative  Exam:   BP 108/74 (BP Location: Right Arm, Patient Position: Sitting, Cuff Size: Normal)   Pulse 67   Ht '5\' 5"'$  (1.651 m) Comment: Reported  Wt 109 lb 6.4 oz (49.6 kg)   LMP 10/14/2016 (Approximate)   BMI 18.21 kg/m   Height: '5\' 5"'$  (165.1 cm) (Reported)  General appearance: alert, cooperative and appears stated age Head: Normocephalic, without obvious abnormality, atraumatic Neck: no adenopathy, supple, symmetrical, trachea midline and thyroid normal to inspection and palpation Lungs: clear to auscultation bilaterally Breasts: normal appearance, no masses or  tenderness Heart: regular rate and rhythm Abdomen: soft, non-tender; bowel sounds normal; no masses,  no organomegaly Extremities: extremities normal, atraumatic, no cyanosis or edema Skin: Skin color, texture, turgor normal. No rashes or lesions Lymph nodes: Cervical, supraclavicular, and axillary nodes normal. No abnormal inguinal nodes palpated Neurologic: Grossly normal   Pelvic: External genitalia:  no lesions              Urethra:  normal appearing urethra with no masses, tenderness or lesions              Bartholins and Skenes: normal                 Vagina: normal appearing vagina with normal color and no discharge, no lesions              Cervix: no lesions              Pap taken: Yes.   Bimanual Exam:  Uterus:  normal size, contour, position, consistency, mobility, non-tender              Adnexa: normal adnexa and no mass, fullness, tenderness               Rectovaginal: Confirms               Anus:  normal sphincter tone, no lesions  Chaperone, Octaviano Batty, CMA, was present for exam.  Assessment/Plan: 1. Well woman exam with routine gynecological exam - Pap  smear obtained today - Mammogram 08/22/2021 - Colonoscopy declined.  Did cologuard in 2019.  Declines any testing at this time. - Bone mineral density 06/2017.  Will repeat next year with mammogram if possible - lab work done with orders below - vaccines reviewed/updated.  Flu Vaccine QUAD 36+ mos IM (Fluarix, Quad PF)  2. Osteopenia, unspecified location - order placed to do with mammogram - DG BONE DENSITY (DXA); Future  3. Blood tests for routine general physical examination - CBC - Comprehensive metabolic panel - Hemoglobin A1c - TSH - Lipid panel  4. Cervical cancer screening - Cytology - PAP( [place])

## 2022-01-06 LAB — LIPID PANEL
Chol/HDL Ratio: 2.8 ratio (ref 0.0–4.4)
Cholesterol, Total: 187 mg/dL (ref 100–199)
HDL: 66 mg/dL (ref >39)
LDL Chol Calc (NIH): 109 mg/dL — ABNORMAL HIGH (ref 0–99)
Triglycerides: 67 mg/dL (ref 0–149)
VLDL Cholesterol Cal: 12 mg/dL (ref 5–40)

## 2022-01-06 LAB — COMPREHENSIVE METABOLIC PANEL
ALT: 13 IU/L (ref 0–32)
AST: 25 IU/L (ref 0–40)
Albumin/Globulin Ratio: 2.5 — ABNORMAL HIGH (ref 1.2–2.2)
Albumin: 4.8 g/dL (ref 3.8–4.9)
Alkaline Phosphatase: 94 IU/L (ref 44–121)
BUN/Creatinine Ratio: 19 (ref 9–23)
BUN: 13 mg/dL (ref 6–24)
Bilirubin Total: 0.3 mg/dL (ref 0.0–1.2)
CO2: 22 mmol/L (ref 20–29)
Calcium: 9.4 mg/dL (ref 8.7–10.2)
Chloride: 105 mmol/L (ref 96–106)
Creatinine, Ser: 0.69 mg/dL (ref 0.57–1.00)
Globulin, Total: 1.9 g/dL (ref 1.5–4.5)
Glucose: 82 mg/dL (ref 70–99)
Potassium: 4.4 mmol/L (ref 3.5–5.2)
Sodium: 143 mmol/L (ref 134–144)
Total Protein: 6.7 g/dL (ref 6.0–8.5)
eGFR: 101 mL/min/{1.73_m2} (ref >59)

## 2022-01-06 LAB — CBC
Hematocrit: 38 % (ref 34.0–46.6)
Hemoglobin: 13 g/dL (ref 11.1–15.9)
MCH: 31.8 pg (ref 26.6–33.0)
MCHC: 34.2 g/dL (ref 31.5–35.7)
MCV: 93 fL (ref 79–97)
Platelets: 231 10*3/uL (ref 150–450)
RBC: 4.09 x10E6/uL (ref 3.77–5.28)
RDW: 11.9 % (ref 11.7–15.4)
WBC: 4.5 10*3/uL (ref 3.4–10.8)

## 2022-01-06 LAB — HEMOGLOBIN A1C
Est. average glucose Bld gHb Est-mCnc: 108 mg/dL
Hgb A1c MFr Bld: 5.4 % (ref 4.8–5.6)

## 2022-01-06 LAB — TSH: TSH: 3.89 u[IU]/mL (ref 0.450–4.500)

## 2022-01-08 LAB — CYTOLOGY - PAP
Comment: NEGATIVE
Diagnosis: NEGATIVE
High risk HPV: NEGATIVE

## 2022-04-18 ENCOUNTER — Other Ambulatory Visit: Payer: Self-pay | Admitting: Obstetrics & Gynecology

## 2022-04-18 DIAGNOSIS — Z1231 Encounter for screening mammogram for malignant neoplasm of breast: Secondary | ICD-10-CM

## 2022-08-31 ENCOUNTER — Ambulatory Visit
Admission: RE | Admit: 2022-08-31 | Discharge: 2022-08-31 | Disposition: A | Discharge status: Home / Self Care | Payer: 59 | Source: Ambulatory Visit | Attending: Obstetrics & Gynecology | Admitting: Obstetrics & Gynecology

## 2022-08-31 DIAGNOSIS — Z1231 Encounter for screening mammogram for malignant neoplasm of breast: Secondary | ICD-10-CM

## 2023-01-18 ENCOUNTER — Encounter (HOSPITAL_BASED_OUTPATIENT_CLINIC_OR_DEPARTMENT_OTHER): Payer: Self-pay | Admitting: Obstetrics & Gynecology

## 2023-01-18 ENCOUNTER — Ambulatory Visit (HOSPITAL_BASED_OUTPATIENT_CLINIC_OR_DEPARTMENT_OTHER): Payer: 59 | Admitting: Certified Nurse Midwife

## 2023-01-18 VITALS — BP 103/72 | HR 68 | Ht 65.0 in | Wt 106.6 lb

## 2023-01-18 DIAGNOSIS — Z23 Encounter for immunization: Secondary | ICD-10-CM

## 2023-01-18 DIAGNOSIS — M8580 Other specified disorders of bone density and structure, unspecified site: Secondary | ICD-10-CM

## 2023-01-18 DIAGNOSIS — Z1231 Encounter for screening mammogram for malignant neoplasm of breast: Secondary | ICD-10-CM | POA: Diagnosis not present

## 2023-01-18 DIAGNOSIS — Z780 Asymptomatic menopausal state: Secondary | ICD-10-CM

## 2023-01-18 DIAGNOSIS — F418 Other specified anxiety disorders: Secondary | ICD-10-CM | POA: Diagnosis not present

## 2023-01-18 MED ORDER — FLUOXETINE HCL 10 MG PO CAPS: 10.0000 mg | ORAL_CAPSULE | Freq: Every day | ORAL | 1 refills | Status: DC

## 2023-01-18 NOTE — Progress Notes (Signed)
See CNM note.

## 2023-01-18 NOTE — Progress Notes (Addendum)
  CNM was informed by RN/CMA that patient was agreeable to receiving care from CNM today. When I entered the room and sat down in front of the patient on the stool, the patient began to cry. I immediately told the patient that it would not be an issue for her to see Dr. Hyacinth Meeker today. Pt stated "Maybe today is just not a good day for me to be here". She continued to cry. Provided w/ tissues. I asked the patient if she was safe at home and she replied "Yes". She denied any abuse and stated she had a support system. Stated "I don't sleep good some nights because my husband does snore". I reassured the patient a few times that Dr. Hyacinth Meeker would like to see her today for her annual gyn exam, but patient again stated "I just don't think today is a good day".  I asked the patient, "Do you think that you may be experiencing any depression or anxiety or a combination of both?" And she replied "both". I discussed the availability of medication and/or counseling. Stated she did try an anti-depressant "years ago". Did not remember which one. Did not remember any negative side effects or why she discontinued it. She was agreeable to a trial of a low-dose SSRI to try to reduce her anxiety and increase her sense of calmness. We discussed risks including decreased libido, possibility of vivid dreams. Pt agreed to start Fluoxetine 10mg  po once daily at bedtime. CNM will call her to follow-up in 1 month. Pt decided she would like to wait until next year for her annual gyn exam. She declined any labwork today. We discussed labwork from one year ago (gave pt a copy).   Discussed availability of Flu Vaccine and pt opted to receive Flu Vaccine today.  Discussed due for bone density, hx osteopenia. Pt opted to do Bone Mineral Density with next Screening Mammogram in May 2025. Order sent.    1. Depression with anxiety -Pt aware availability counseling/medication and elected trial of Fluoxetine. Discussed potential side  effects. - FLUoxetine (PROZAC) 10 MG capsule; Take 1 capsule (10 mg total) by mouth daily.  Dispense: 30 capsule; Refill: 1 -Follow-up planned in 1 month.  2. Need for influenza vaccination - Flu vaccine trivalent PF, 6mos and older(Flulaval,Afluria,Fluarix,Fluzone)  3. Osteopenia after menopause -Pt prefers BMD with next mammogram 08/2023 - DG BONE DENSITY (DXA); Future  4. Encounter for screening mammogram for malignant neoplasm of breast - MM 3D SCREENING MAMMOGRAM BILATERAL BREAST; Future      Lupita Leash K Trevaris Pennella

## 2023-03-14 ENCOUNTER — Other Ambulatory Visit (HOSPITAL_BASED_OUTPATIENT_CLINIC_OR_DEPARTMENT_OTHER): Payer: Self-pay | Admitting: Certified Nurse Midwife

## 2023-03-14 DIAGNOSIS — F418 Other specified anxiety disorders: Secondary | ICD-10-CM

## 2023-05-12 ENCOUNTER — Other Ambulatory Visit (HOSPITAL_BASED_OUTPATIENT_CLINIC_OR_DEPARTMENT_OTHER): Payer: Self-pay | Admitting: Certified Nurse Midwife

## 2023-05-12 DIAGNOSIS — F418 Other specified anxiety disorders: Secondary | ICD-10-CM

## 2023-07-07 ENCOUNTER — Other Ambulatory Visit (HOSPITAL_BASED_OUTPATIENT_CLINIC_OR_DEPARTMENT_OTHER): Payer: Self-pay | Admitting: Certified Nurse Midwife

## 2023-07-07 DIAGNOSIS — F418 Other specified anxiety disorders: Secondary | ICD-10-CM

## 2023-09-06 ENCOUNTER — Other Ambulatory Visit (HOSPITAL_BASED_OUTPATIENT_CLINIC_OR_DEPARTMENT_OTHER): Payer: Self-pay | Admitting: Certified Nurse Midwife

## 2023-09-06 DIAGNOSIS — F418 Other specified anxiety disorders: Secondary | ICD-10-CM

## 2023-10-09 IMAGING — MG MM DIGITAL SCREENING BILAT W/ TOMO AND CAD
8 series · 9 of 24 positions shown · non-contrast
Comparison: Previous exam(s).

CLINICAL DATA: Screening.

EXAM:
DIGITAL SCREENING BILATERAL MAMMOGRAM WITH TOMOSYNTHESIS AND CAD
TECHNIQUE: Bilateral screening digital craniocaudal and mediolateral oblique
mammograms were obtained. Bilateral screening digital breast
tomosynthesis was performed. The images were evaluated with
computer-aided detection.

[R MLO synth-2D]
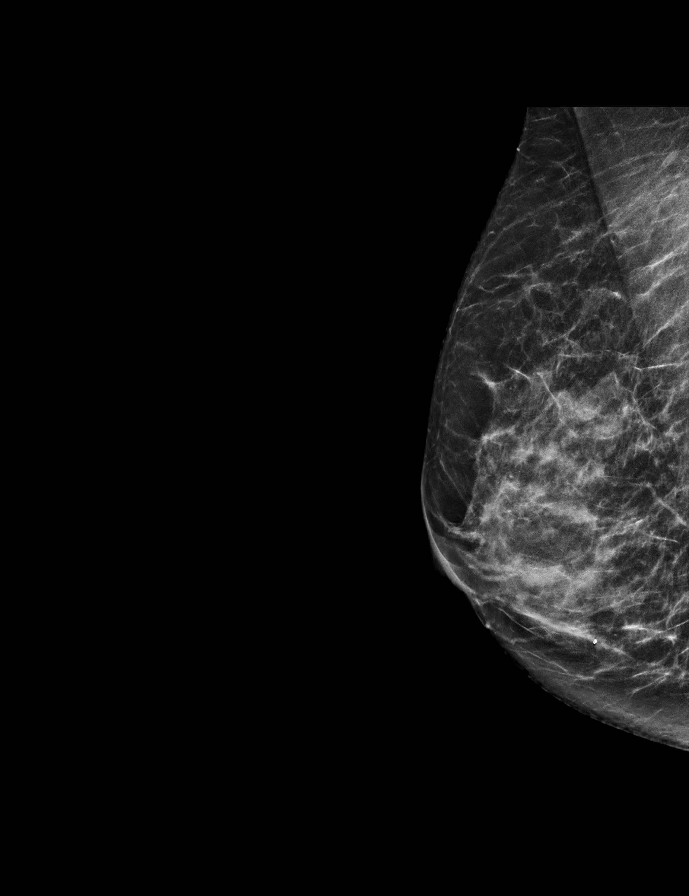

[R CC synth-2D]
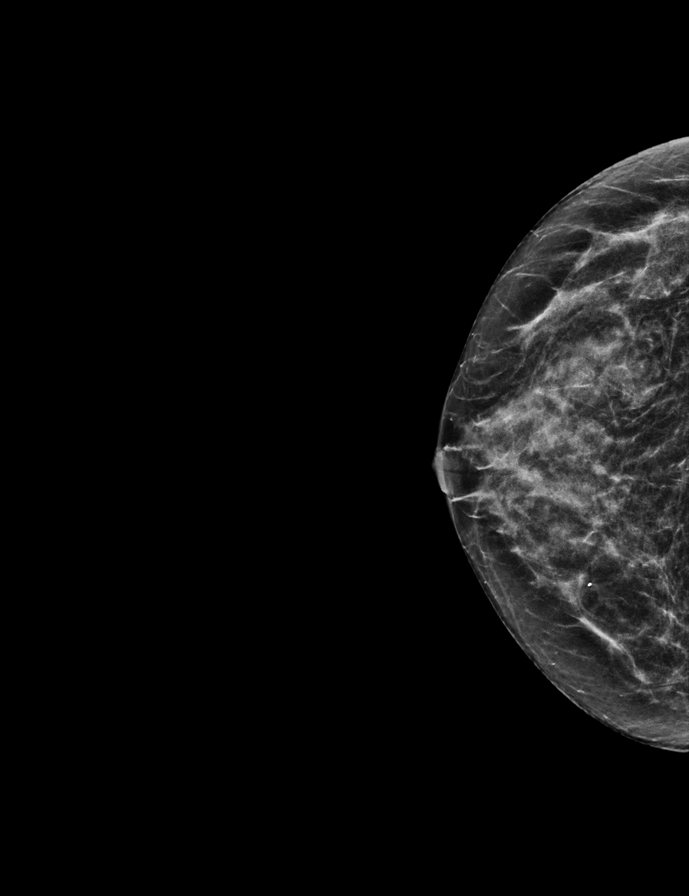

[L CC synth-2D]
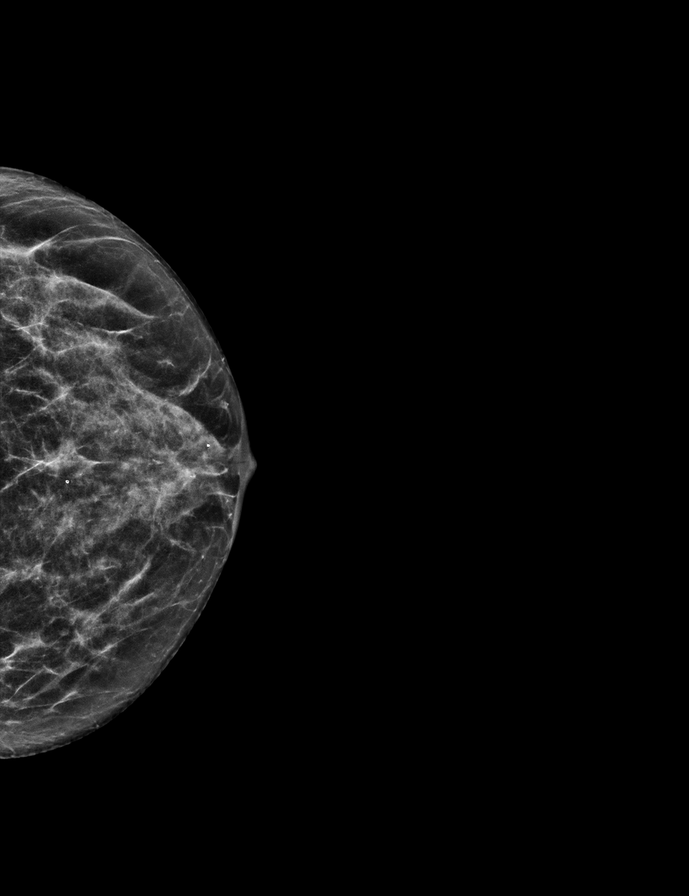

[L MLO synth-2D]
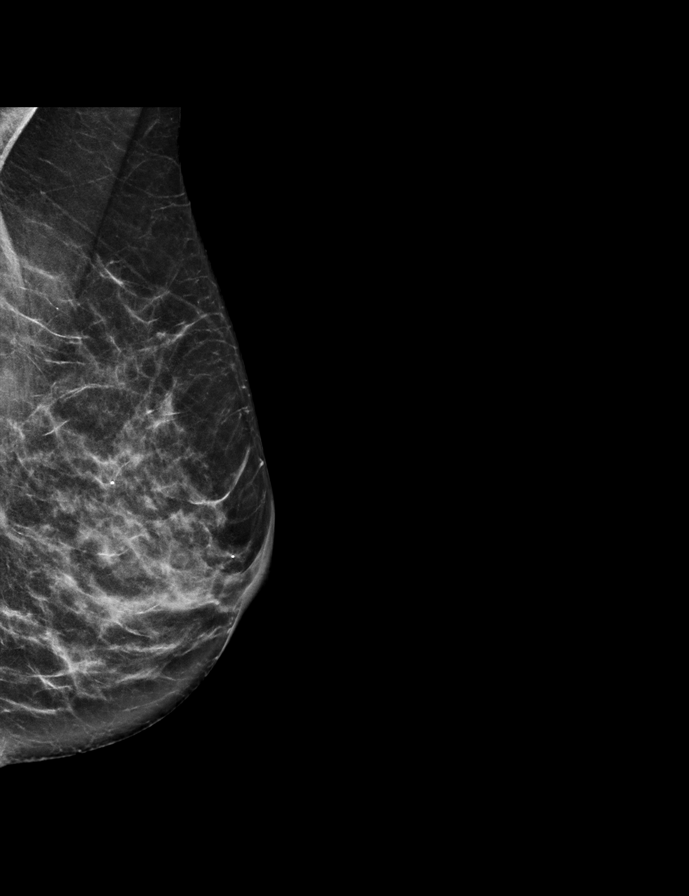

[L CC tomo · 2 of 49 frames shown]
[frame 16/49]
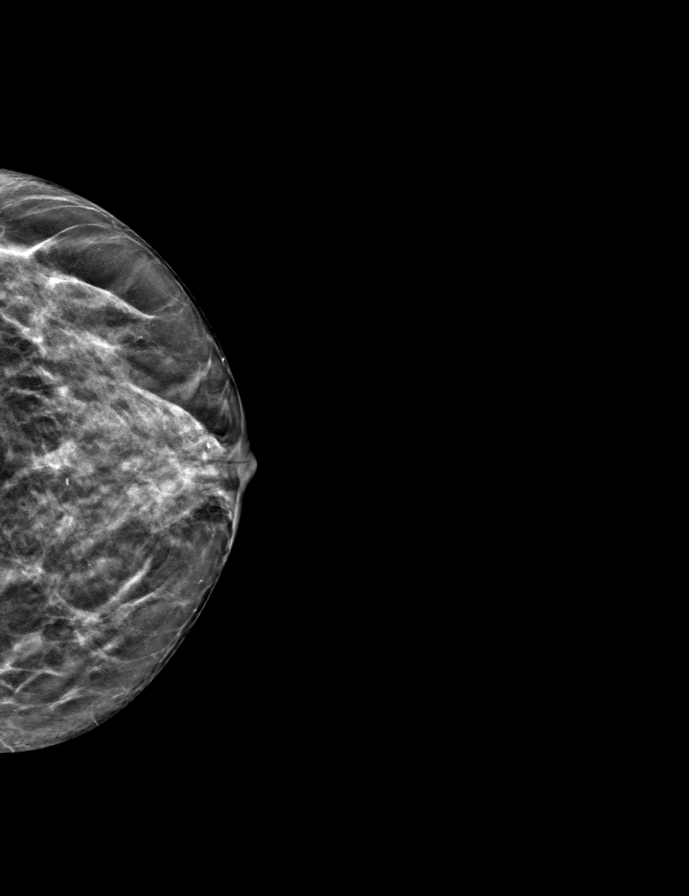
[frame 25/49]
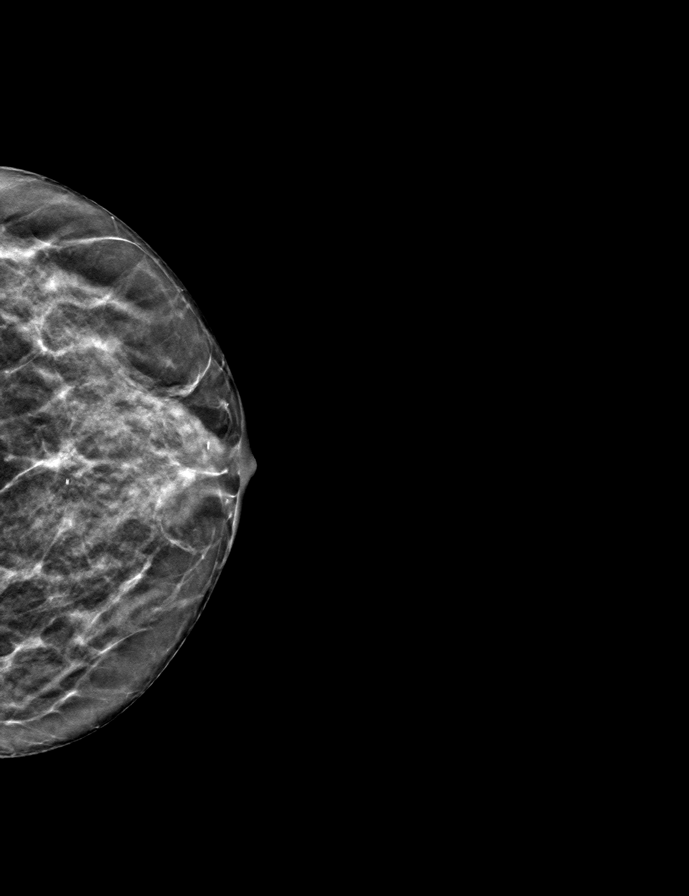

[R MLO tomo · tomo slice 27/52.0]
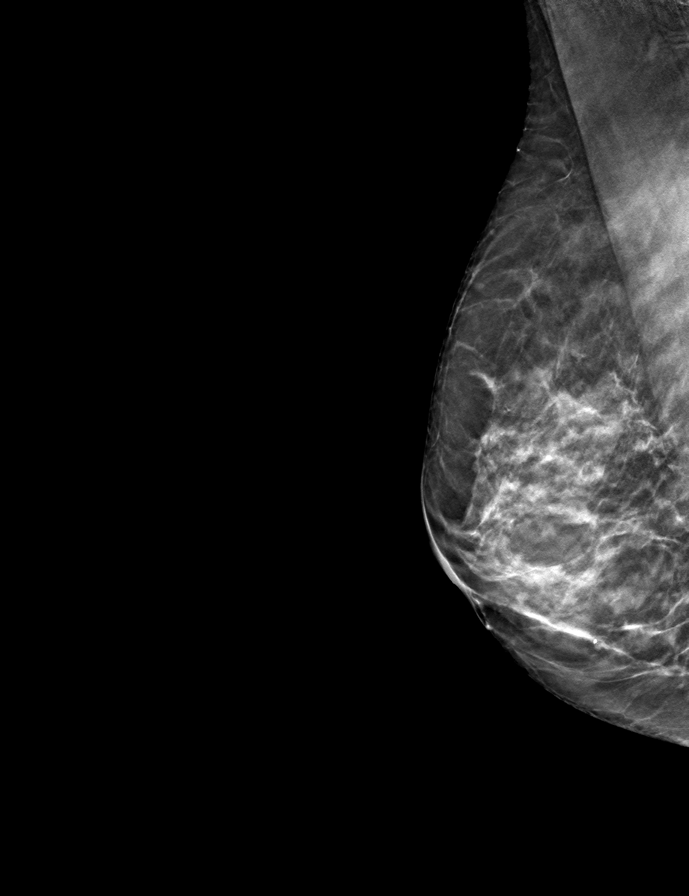

[R CC tomo · tomo slice 25/49.0]
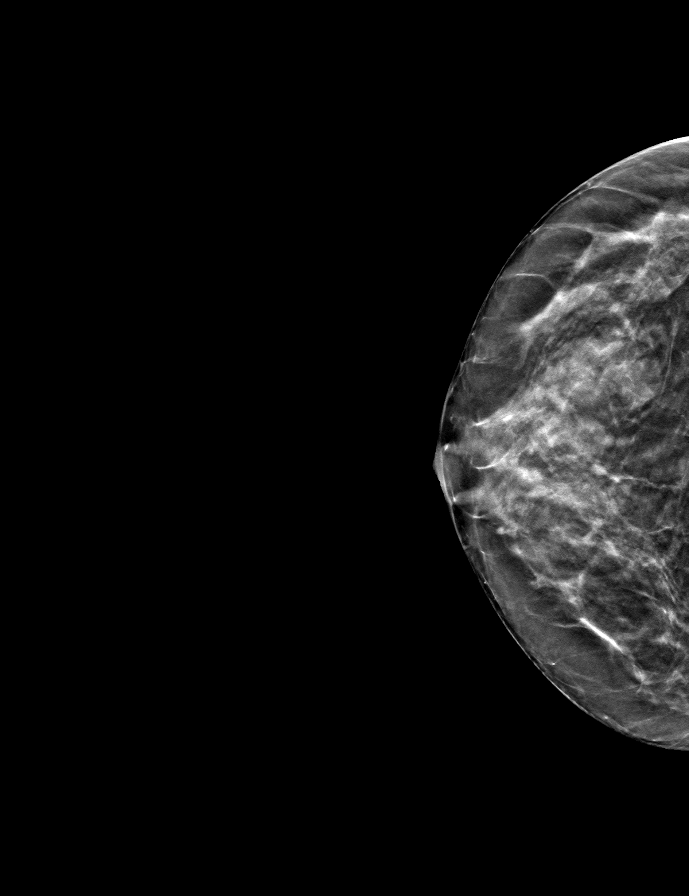

[L MLO tomo · tomo slice 29/58.0]
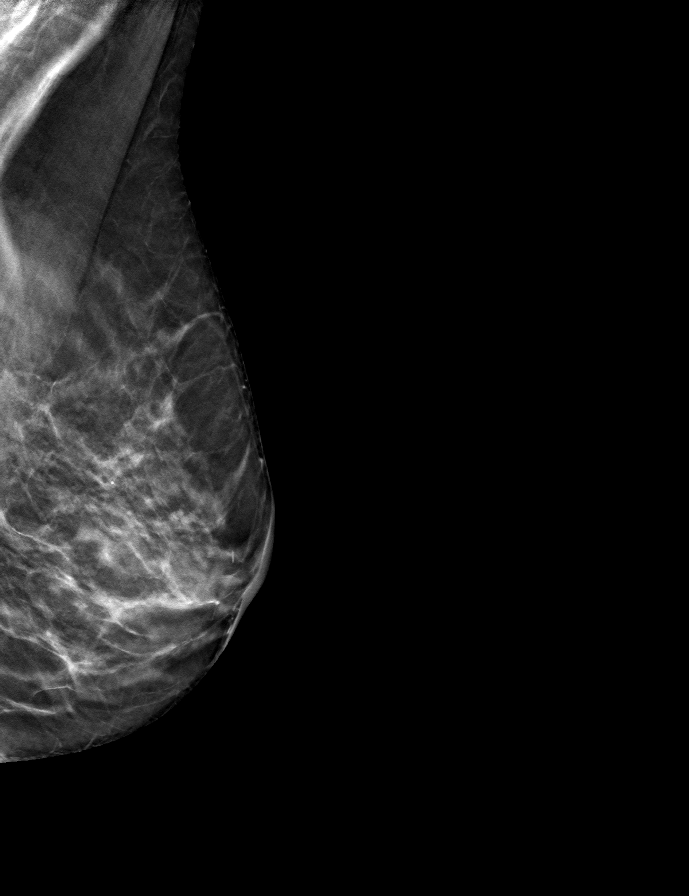

[9 of 24 positions shown; findings below may reference images not displayed]

ACR Breast Density Category c: The breast tissue is heterogeneously
dense, which may obscure small masses.
FINDINGS: There are no findings suspicious for malignancy.
IMPRESSION: No mammographic evidence of malignancy. A result letter of this
screening mammogram will be mailed directly to the patient.

RECOMMENDATION:
Screening mammogram in one year. (Code:Q3-W-BC3)

BI-RADS CATEGORY  1: Negative.

## 2023-11-01 ENCOUNTER — Encounter: Payer: Self-pay | Admitting: Advanced Practice Midwife

## 2023-11-19 ENCOUNTER — Ambulatory Visit: Payer: 59

## 2023-11-19 ENCOUNTER — Other Ambulatory Visit: Payer: 59

## 2023-12-07 ENCOUNTER — Encounter (HOSPITAL_BASED_OUTPATIENT_CLINIC_OR_DEPARTMENT_OTHER): Payer: Self-pay | Admitting: Radiology

## 2023-12-07 ENCOUNTER — Ambulatory Visit (HOSPITAL_BASED_OUTPATIENT_CLINIC_OR_DEPARTMENT_OTHER)
Admission: RE | Admit: 2023-12-07 | Discharge: 2023-12-07 | Disposition: A | Discharge status: Home / Self Care | Source: Ambulatory Visit | Attending: Certified Nurse Midwife | Admitting: Certified Nurse Midwife

## 2023-12-07 DIAGNOSIS — Z1231 Encounter for screening mammogram for malignant neoplasm of breast: Secondary | ICD-10-CM | POA: Insufficient documentation

## 2023-12-20 ENCOUNTER — Ambulatory Visit (HOSPITAL_BASED_OUTPATIENT_CLINIC_OR_DEPARTMENT_OTHER): Admitting: Radiology

## 2024-01-23 ENCOUNTER — Ambulatory Visit (HOSPITAL_BASED_OUTPATIENT_CLINIC_OR_DEPARTMENT_OTHER): Payer: 59 | Admitting: Obstetrics & Gynecology

## 2024-03-05 ENCOUNTER — Other Ambulatory Visit (HOSPITAL_BASED_OUTPATIENT_CLINIC_OR_DEPARTMENT_OTHER): Payer: Self-pay | Admitting: Certified Nurse Midwife

## 2024-03-05 DIAGNOSIS — F418 Other specified anxiety disorders: Secondary | ICD-10-CM
